# Patient Record
Sex: Female | Born: 1985
Health system: Southern US, Community
[De-identification: ages and names within clinical notes are randomized; demographics above are authoritative.]

## PROBLEM LIST (undated history)

## (undated) DIAGNOSIS — M549 Dorsalgia, unspecified: Secondary | ICD-10-CM

## (undated) DIAGNOSIS — R002 Palpitations: Secondary | ICD-10-CM

## (undated) DIAGNOSIS — O4590 Premature separation of placenta, unspecified, unspecified trimester: Secondary | ICD-10-CM

## (undated) DIAGNOSIS — E559 Vitamin D deficiency, unspecified: Secondary | ICD-10-CM

## (undated) DIAGNOSIS — M2550 Pain in unspecified joint: Secondary | ICD-10-CM

## (undated) DIAGNOSIS — K5900 Constipation, unspecified: Secondary | ICD-10-CM

## (undated) HISTORY — DX: Constipation, unspecified: K59.00

## (undated) HISTORY — PX: DENTAL SURGERY: SHX609

## (undated) HISTORY — DX: Pain in unspecified joint: M25.50

## (undated) HISTORY — DX: Vitamin D deficiency, unspecified: E55.9

## (undated) HISTORY — DX: Palpitations: R00.2

## (undated) HISTORY — PX: HERNIA REPAIR: SHX51

## (undated) HISTORY — DX: Dorsalgia, unspecified: M54.9

---
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
## 2004-10-06 ENCOUNTER — Inpatient Hospital Stay (HOSPITAL_COMMUNITY): Admission: AD | Admit: 2004-10-06 | Discharge: 2004-10-06 | Payer: Self-pay | Admitting: Obstetrics and Gynecology

## 2004-10-09 ENCOUNTER — Inpatient Hospital Stay (HOSPITAL_COMMUNITY): Admission: AD | Admit: 2004-10-09 | Discharge: 2004-10-09 | Payer: Self-pay | Admitting: Obstetrics and Gynecology

## 2004-10-16 ENCOUNTER — Inpatient Hospital Stay (HOSPITAL_COMMUNITY): Admission: RE | Admit: 2004-10-16 | Discharge: 2004-10-16 | Payer: Self-pay | Admitting: Obstetrics and Gynecology

## 2004-11-20 ENCOUNTER — Inpatient Hospital Stay (HOSPITAL_COMMUNITY): Admission: AD | Admit: 2004-11-20 | Discharge: 2004-11-21 | Payer: Self-pay | Admitting: Obstetrics & Gynecology

## 2004-11-28 ENCOUNTER — Ambulatory Visit (HOSPITAL_COMMUNITY): Admission: AD | Admit: 2004-11-28 | Discharge: 2004-11-28 | Payer: Self-pay | Admitting: Gynecology

## 2004-11-28 ENCOUNTER — Encounter (INDEPENDENT_AMBULATORY_CARE_PROVIDER_SITE_OTHER): Payer: Self-pay | Admitting: Specialist

## 2005-01-01 ENCOUNTER — Encounter: Payer: Self-pay | Admitting: Obstetrics and Gynecology

## 2005-01-01 ENCOUNTER — Ambulatory Visit: Payer: Self-pay | Admitting: Obstetrics and Gynecology

## 2005-04-28 ENCOUNTER — Inpatient Hospital Stay (HOSPITAL_COMMUNITY): Admission: AD | Admit: 2005-04-28 | Discharge: 2005-04-28 | Payer: Self-pay | Admitting: Obstetrics and Gynecology

## 2005-05-09 ENCOUNTER — Inpatient Hospital Stay (HOSPITAL_COMMUNITY): Admission: AD | Admit: 2005-05-09 | Discharge: 2005-05-09 | Payer: Self-pay | Admitting: Family Medicine

## 2005-05-14 ENCOUNTER — Inpatient Hospital Stay (HOSPITAL_COMMUNITY): Admission: AD | Admit: 2005-05-14 | Discharge: 2005-05-14 | Payer: Self-pay | Admitting: Family Medicine

## 2005-05-25 ENCOUNTER — Inpatient Hospital Stay (HOSPITAL_COMMUNITY): Admission: AD | Admit: 2005-05-25 | Discharge: 2005-05-25 | Payer: Self-pay | Admitting: *Deleted

## 2005-05-29 ENCOUNTER — Inpatient Hospital Stay (HOSPITAL_COMMUNITY): Admission: AD | Admit: 2005-05-29 | Discharge: 2005-05-29 | Payer: Self-pay | Admitting: Family Medicine

## 2005-06-29 ENCOUNTER — Inpatient Hospital Stay (HOSPITAL_COMMUNITY): Admission: AD | Admit: 2005-06-29 | Discharge: 2005-06-29 | Payer: Self-pay | Admitting: Obstetrics and Gynecology

## 2005-07-05 ENCOUNTER — Inpatient Hospital Stay (HOSPITAL_COMMUNITY): Admission: AD | Admit: 2005-07-05 | Discharge: 2005-07-05 | Payer: Self-pay | Admitting: Family Medicine

## 2005-08-02 ENCOUNTER — Inpatient Hospital Stay (HOSPITAL_COMMUNITY): Admission: AD | Admit: 2005-08-02 | Discharge: 2005-08-02 | Payer: Self-pay | Admitting: Obstetrics & Gynecology

## 2005-08-22 ENCOUNTER — Inpatient Hospital Stay (HOSPITAL_COMMUNITY): Admission: AD | Admit: 2005-08-22 | Discharge: 2005-08-22 | Payer: Self-pay | Admitting: *Deleted

## 2005-12-14 ENCOUNTER — Ambulatory Visit: Payer: Self-pay | Admitting: Obstetrics and Gynecology

## 2005-12-14 ENCOUNTER — Inpatient Hospital Stay (HOSPITAL_COMMUNITY): Admission: AD | Admit: 2005-12-14 | Discharge: 2005-12-14 | Payer: Self-pay | Admitting: Obstetrics & Gynecology

## 2005-12-24 ENCOUNTER — Ambulatory Visit: Payer: Self-pay | Admitting: Family Medicine

## 2005-12-28 ENCOUNTER — Ambulatory Visit: Payer: Self-pay | Admitting: Obstetrics & Gynecology

## 2005-12-28 ENCOUNTER — Ambulatory Visit (HOSPITAL_COMMUNITY): Admission: RE | Admit: 2005-12-28 | Discharge: 2005-12-28 | Payer: Self-pay | Admitting: *Deleted

## 2005-12-31 ENCOUNTER — Ambulatory Visit: Payer: Self-pay | Admitting: Family Medicine

## 2006-01-01 ENCOUNTER — Inpatient Hospital Stay (HOSPITAL_COMMUNITY): Admission: AD | Admit: 2006-01-01 | Discharge: 2006-01-01 | Payer: Self-pay | Admitting: Obstetrics and Gynecology

## 2006-01-04 ENCOUNTER — Encounter: Payer: Self-pay | Admitting: Obstetrics and Gynecology

## 2006-01-04 ENCOUNTER — Ambulatory Visit: Payer: Self-pay | Admitting: Obstetrics & Gynecology

## 2006-01-04 ENCOUNTER — Encounter (INDEPENDENT_AMBULATORY_CARE_PROVIDER_SITE_OTHER): Payer: Self-pay | Admitting: *Deleted

## 2006-01-04 ENCOUNTER — Inpatient Hospital Stay (HOSPITAL_COMMUNITY): Admission: AD | Admit: 2006-01-04 | Discharge: 2006-01-07 | Payer: Self-pay | Admitting: Obstetrics and Gynecology

## 2006-01-04 DIAGNOSIS — O4590 Premature separation of placenta, unspecified, unspecified trimester: Secondary | ICD-10-CM

## 2006-06-11 ENCOUNTER — Inpatient Hospital Stay (HOSPITAL_COMMUNITY): Admission: AD | Admit: 2006-06-11 | Discharge: 2006-06-11 | Payer: Self-pay | Admitting: Gynecology

## 2006-11-07 ENCOUNTER — Emergency Department (HOSPITAL_COMMUNITY): Admission: EM | Admit: 2006-11-07 | Discharge: 2006-11-07 | Payer: Self-pay | Admitting: Emergency Medicine

## 2007-01-29 ENCOUNTER — Emergency Department (HOSPITAL_COMMUNITY): Admission: EM | Admit: 2007-01-29 | Discharge: 2007-01-29 | Payer: Self-pay | Admitting: Emergency Medicine

## 2008-03-08 ENCOUNTER — Inpatient Hospital Stay (HOSPITAL_COMMUNITY): Admission: AD | Admit: 2008-03-08 | Discharge: 2008-03-08 | Payer: Self-pay | Admitting: Family Medicine

## 2008-03-11 ENCOUNTER — Inpatient Hospital Stay (HOSPITAL_COMMUNITY): Admission: AD | Admit: 2008-03-11 | Discharge: 2008-03-11 | Payer: Self-pay | Admitting: Obstetrics and Gynecology

## 2008-03-19 ENCOUNTER — Inpatient Hospital Stay (HOSPITAL_COMMUNITY): Admission: RE | Admit: 2008-03-19 | Discharge: 2008-03-19 | Payer: Self-pay | Admitting: Obstetrics & Gynecology

## 2008-04-05 ENCOUNTER — Ambulatory Visit: Payer: Self-pay | Admitting: Family Medicine

## 2008-04-05 ENCOUNTER — Encounter: Payer: Self-pay | Admitting: Family Medicine

## 2008-04-05 LAB — CONVERTED CEMR LAB
Antibody Screen: NEGATIVE
Basophils Relative: 0 % (ref 0–1)
HCT: 39.3 % (ref 36.0–46.0)
Lymphocytes Relative: 33 % (ref 12–46)
MCV: 87.9 fL (ref 78.0–100.0)
Platelets: 325 10*3/uL (ref 150–400)
RBC: 4.47 M/uL (ref 3.87–5.11)
Rubella: 20 intl units/mL — ABNORMAL HIGH
WBC: 4.8 10*3/uL (ref 4.0–10.5)

## 2008-04-18 ENCOUNTER — Encounter: Payer: Self-pay | Admitting: Family Medicine

## 2008-04-18 ENCOUNTER — Ambulatory Visit: Payer: Self-pay | Admitting: Family Medicine

## 2008-04-18 LAB — CONVERTED CEMR LAB

## 2008-04-26 ENCOUNTER — Telehealth (INDEPENDENT_AMBULATORY_CARE_PROVIDER_SITE_OTHER): Payer: Self-pay | Admitting: *Deleted

## 2008-04-30 ENCOUNTER — Ambulatory Visit: Payer: Self-pay | Admitting: Family Medicine

## 2008-05-10 ENCOUNTER — Encounter: Payer: Self-pay | Admitting: Family Medicine

## 2008-05-10 DIAGNOSIS — E669 Obesity, unspecified: Secondary | ICD-10-CM | POA: Insufficient documentation

## 2008-05-10 DIAGNOSIS — O34219 Maternal care for unspecified type scar from previous cesarean delivery: Secondary | ICD-10-CM | POA: Insufficient documentation

## 2008-05-10 DIAGNOSIS — E6609 Other obesity due to excess calories: Secondary | ICD-10-CM | POA: Insufficient documentation

## 2008-05-11 ENCOUNTER — Ambulatory Visit: Payer: Self-pay | Admitting: Family Medicine

## 2008-05-17 ENCOUNTER — Ambulatory Visit: Payer: Self-pay | Admitting: Family Medicine

## 2008-05-17 ENCOUNTER — Encounter: Payer: Self-pay | Admitting: Family Medicine

## 2008-06-14 ENCOUNTER — Encounter: Payer: Self-pay | Admitting: Family Medicine

## 2008-06-14 ENCOUNTER — Ambulatory Visit: Payer: Self-pay | Admitting: Family Medicine

## 2008-06-19 ENCOUNTER — Encounter: Payer: Self-pay | Admitting: Family Medicine

## 2008-07-02 ENCOUNTER — Telehealth: Payer: Self-pay | Admitting: Family Medicine

## 2008-07-05 ENCOUNTER — Telehealth: Payer: Self-pay | Admitting: Family Medicine

## 2008-07-09 ENCOUNTER — Telehealth: Payer: Self-pay | Admitting: Family Medicine

## 2008-07-12 ENCOUNTER — Telehealth: Payer: Self-pay | Admitting: Family Medicine

## 2008-07-12 ENCOUNTER — Ambulatory Visit: Payer: Self-pay | Admitting: Family Medicine

## 2008-07-12 ENCOUNTER — Encounter: Payer: Self-pay | Admitting: Family Medicine

## 2008-07-12 DIAGNOSIS — Q638 Other specified congenital malformations of kidney: Secondary | ICD-10-CM | POA: Insufficient documentation

## 2008-07-16 ENCOUNTER — Ambulatory Visit (HOSPITAL_COMMUNITY): Admission: RE | Admit: 2008-07-16 | Discharge: 2008-07-16 | Payer: Self-pay | Admitting: Family Medicine

## 2008-07-18 ENCOUNTER — Telehealth: Payer: Self-pay | Admitting: Family Medicine

## 2008-07-24 ENCOUNTER — Telehealth: Payer: Self-pay | Admitting: Family Medicine

## 2008-08-09 ENCOUNTER — Encounter: Payer: Self-pay | Admitting: *Deleted

## 2008-08-09 ENCOUNTER — Ambulatory Visit: Payer: Self-pay | Admitting: Family Medicine

## 2008-08-09 ENCOUNTER — Encounter: Payer: Self-pay | Admitting: Family Medicine

## 2008-08-09 LAB — CONVERTED CEMR LAB
Hemoglobin: 11.4 g/dL — ABNORMAL LOW (ref 12.0–15.0)
Platelets: 277 10*3/uL (ref 150–400)
WBC: 4.7 10*3/uL (ref 4.0–10.5)

## 2008-08-13 ENCOUNTER — Ambulatory Visit (HOSPITAL_COMMUNITY): Admission: RE | Admit: 2008-08-13 | Discharge: 2008-08-13 | Payer: Self-pay | Admitting: Family Medicine

## 2008-08-21 ENCOUNTER — Encounter: Payer: Self-pay | Admitting: Family Medicine

## 2008-08-21 ENCOUNTER — Ambulatory Visit: Payer: Self-pay | Admitting: Family Medicine

## 2008-08-26 ENCOUNTER — Ambulatory Visit: Payer: Self-pay | Admitting: Obstetrics and Gynecology

## 2008-08-26 ENCOUNTER — Inpatient Hospital Stay (HOSPITAL_COMMUNITY): Admission: AD | Admit: 2008-08-26 | Discharge: 2008-08-27 | Payer: Self-pay | Admitting: Obstetrics and Gynecology

## 2008-08-27 ENCOUNTER — Encounter: Payer: Self-pay | Admitting: Family Medicine

## 2008-08-27 ENCOUNTER — Telehealth: Payer: Self-pay | Admitting: *Deleted

## 2008-08-27 ENCOUNTER — Ambulatory Visit: Payer: Self-pay | Admitting: Family Medicine

## 2008-08-27 LAB — CONVERTED CEMR LAB
Chlamydia, DNA Probe: NEGATIVE
GC Probe Amp, Genital: NEGATIVE
Whiff Test: NEGATIVE

## 2008-08-31 ENCOUNTER — Telehealth: Payer: Self-pay | Admitting: Family Medicine

## 2008-09-03 ENCOUNTER — Ambulatory Visit: Payer: Self-pay | Admitting: Family Medicine

## 2008-09-03 ENCOUNTER — Encounter: Payer: Self-pay | Admitting: Family Medicine

## 2008-09-03 ENCOUNTER — Telehealth: Payer: Self-pay | Admitting: Family Medicine

## 2008-09-10 ENCOUNTER — Telehealth: Payer: Self-pay | Admitting: *Deleted

## 2008-09-14 ENCOUNTER — Ambulatory Visit: Payer: Self-pay | Admitting: Family Medicine

## 2008-09-17 ENCOUNTER — Telehealth: Payer: Self-pay | Admitting: Family Medicine

## 2008-09-20 ENCOUNTER — Encounter: Payer: Self-pay | Admitting: Family Medicine

## 2008-09-20 ENCOUNTER — Encounter: Payer: Self-pay | Admitting: *Deleted

## 2008-09-20 ENCOUNTER — Ambulatory Visit: Payer: Self-pay | Admitting: Family Medicine

## 2008-09-27 ENCOUNTER — Telehealth: Payer: Self-pay | Admitting: Family Medicine

## 2008-10-02 ENCOUNTER — Encounter: Payer: Self-pay | Admitting: Family Medicine

## 2008-10-02 ENCOUNTER — Ambulatory Visit (HOSPITAL_COMMUNITY): Admission: RE | Admit: 2008-10-02 | Discharge: 2008-10-02 | Payer: Self-pay | Admitting: *Deleted

## 2008-10-02 ENCOUNTER — Encounter: Payer: Self-pay | Admitting: *Deleted

## 2008-10-04 ENCOUNTER — Ambulatory Visit: Payer: Self-pay | Admitting: Family Medicine

## 2008-10-05 ENCOUNTER — Telehealth (INDEPENDENT_AMBULATORY_CARE_PROVIDER_SITE_OTHER): Payer: Self-pay | Admitting: *Deleted

## 2008-10-08 ENCOUNTER — Encounter (INDEPENDENT_AMBULATORY_CARE_PROVIDER_SITE_OTHER): Payer: Self-pay | Admitting: *Deleted

## 2008-10-11 ENCOUNTER — Telehealth: Payer: Self-pay | Admitting: Family Medicine

## 2008-10-11 ENCOUNTER — Encounter: Payer: Self-pay | Admitting: *Deleted

## 2008-10-15 ENCOUNTER — Encounter: Admission: RE | Admit: 2008-10-15 | Discharge: 2008-10-15 | Payer: Self-pay | Admitting: Family Medicine

## 2008-10-15 ENCOUNTER — Telehealth: Payer: Self-pay | Admitting: Family Medicine

## 2008-10-18 ENCOUNTER — Encounter: Payer: Self-pay | Admitting: Family Medicine

## 2008-10-18 ENCOUNTER — Ambulatory Visit: Payer: Self-pay | Admitting: Family Medicine

## 2008-10-23 ENCOUNTER — Encounter: Payer: Self-pay | Admitting: Family Medicine

## 2008-10-24 ENCOUNTER — Ambulatory Visit: Payer: Self-pay | Admitting: Family Medicine

## 2008-11-01 ENCOUNTER — Ambulatory Visit: Payer: Self-pay | Admitting: Family Medicine

## 2008-11-09 ENCOUNTER — Ambulatory Visit: Payer: Self-pay | Admitting: Family Medicine

## 2008-11-12 ENCOUNTER — Encounter: Payer: Self-pay | Admitting: *Deleted

## 2008-11-13 ENCOUNTER — Ambulatory Visit: Payer: Self-pay | Admitting: Obstetrics & Gynecology

## 2008-11-13 ENCOUNTER — Encounter: Payer: Self-pay | Admitting: Family Medicine

## 2008-11-13 ENCOUNTER — Ambulatory Visit (HOSPITAL_COMMUNITY): Admission: RE | Admit: 2008-11-13 | Discharge: 2008-11-13 | Payer: Self-pay | Admitting: *Deleted

## 2008-11-13 ENCOUNTER — Encounter: Payer: Self-pay | Admitting: *Deleted

## 2008-11-14 ENCOUNTER — Ambulatory Visit: Payer: Self-pay | Admitting: Family Medicine

## 2008-11-16 ENCOUNTER — Telehealth: Payer: Self-pay | Admitting: Family Medicine

## 2008-11-16 ENCOUNTER — Encounter: Payer: Self-pay | Admitting: *Deleted

## 2008-11-16 ENCOUNTER — Ambulatory Visit: Payer: Self-pay | Admitting: Obstetrics & Gynecology

## 2008-11-20 ENCOUNTER — Inpatient Hospital Stay (HOSPITAL_COMMUNITY): Admission: RE | Admit: 2008-11-20 | Discharge: 2008-11-23 | Payer: Self-pay | Admitting: Obstetrics & Gynecology

## 2008-11-20 ENCOUNTER — Ambulatory Visit: Payer: Self-pay | Admitting: Physician Assistant

## 2008-12-10 ENCOUNTER — Telehealth: Payer: Self-pay | Admitting: Family Medicine

## 2008-12-11 ENCOUNTER — Encounter: Payer: Self-pay | Admitting: Family Medicine

## 2008-12-27 ENCOUNTER — Ambulatory Visit: Payer: Self-pay | Admitting: Family Medicine

## 2009-01-20 ENCOUNTER — Telehealth: Payer: Self-pay | Admitting: Family Medicine

## 2009-03-29 ENCOUNTER — Ambulatory Visit: Payer: Self-pay | Admitting: Family Medicine

## 2009-03-29 ENCOUNTER — Telehealth: Payer: Self-pay | Admitting: Family Medicine

## 2009-03-29 DIAGNOSIS — N921 Excessive and frequent menstruation with irregular cycle: Secondary | ICD-10-CM

## 2009-03-29 LAB — CONVERTED CEMR LAB
Beta hcg, urine, semiquantitative: NEGATIVE
Whiff Test: NEGATIVE

## 2009-12-25 ENCOUNTER — Inpatient Hospital Stay (HOSPITAL_COMMUNITY)
Admission: AD | Admit: 2009-12-25 | Discharge: 2009-12-25 | Payer: Self-pay | Source: Home / Self Care | Admitting: Obstetrics and Gynecology

## 2010-01-09 ENCOUNTER — Encounter: Payer: Self-pay | Admitting: Family Medicine

## 2010-01-17 IMAGING — US US OB COMP LESS 14 WK
1 series · 14 of 28 positions shown · non-contrast
Comparison: none

OBSTETRICAL ULTRASOUND:
 This ultrasound exam was performed in the [HOSPITAL] Ultrasound Department.  The OB US report was generated in the AS system, and faxed to the ordering physician.  This report is also available in [REDACTED] PACS.

[Series 1: us ob comp less 14 wks · 14 of 44 slices shown]
[im 2/44]
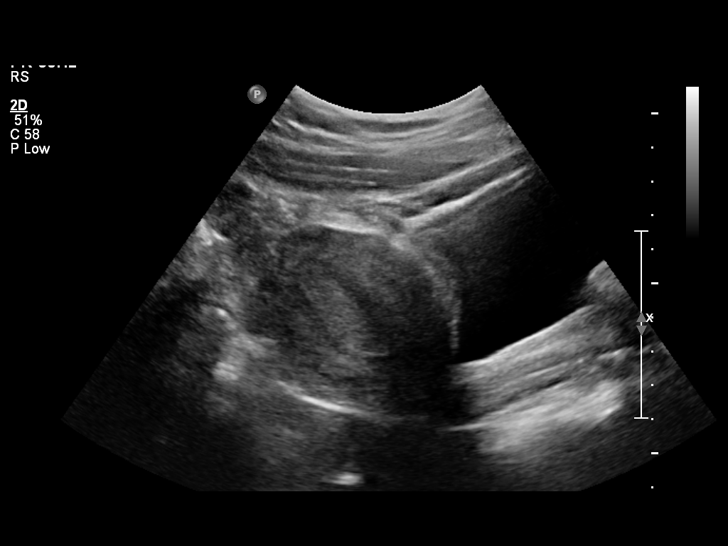
[im 5/44]
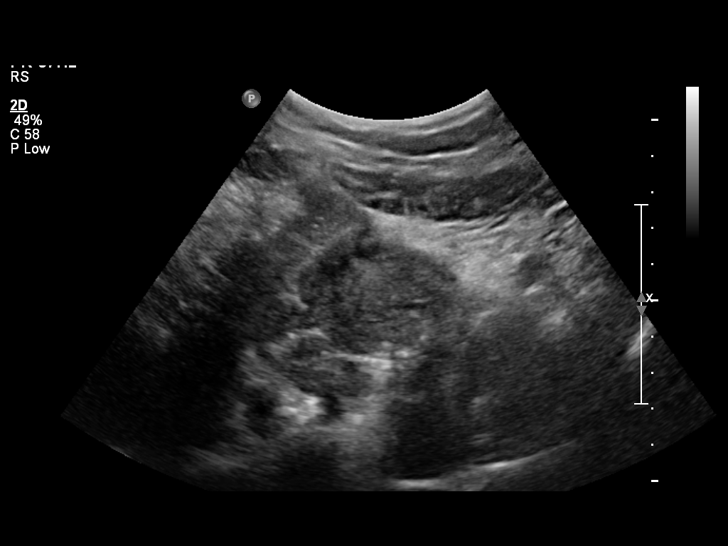
[im 8/44]
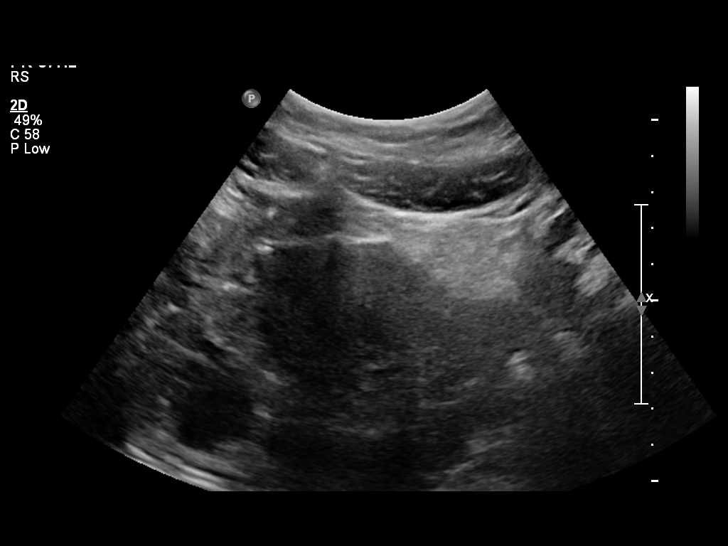
[im 12/44]
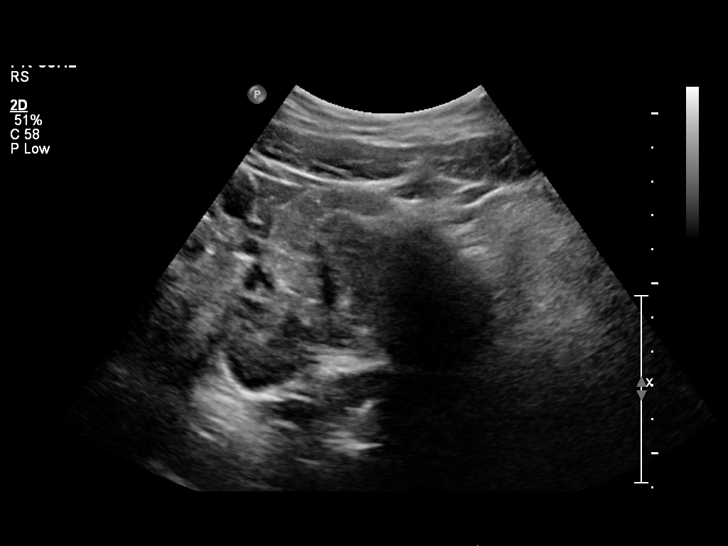
[im 15/44]
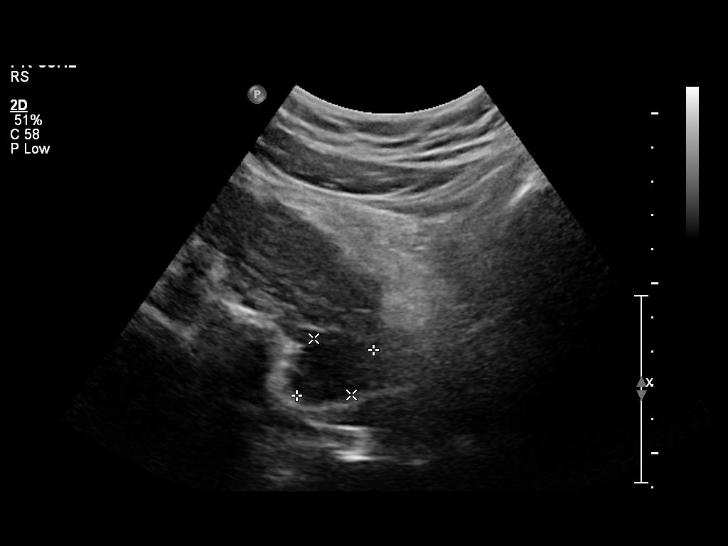
[im 18/44]
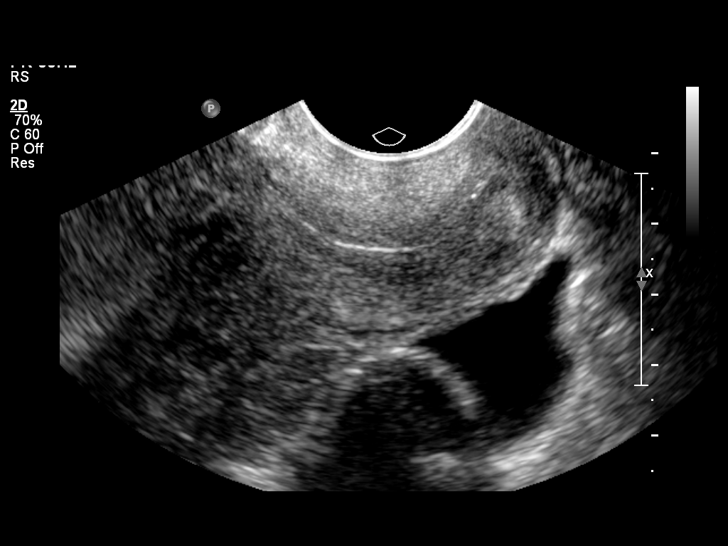
[im 21/44]
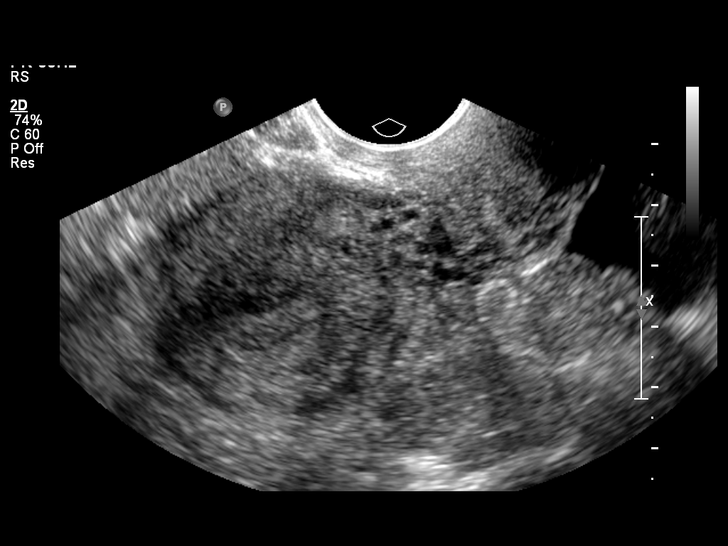
[im 24/44]
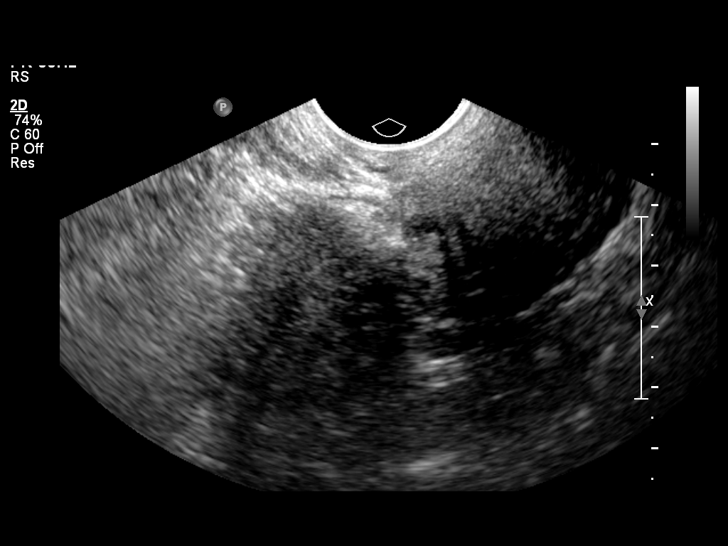
[im 28/44]
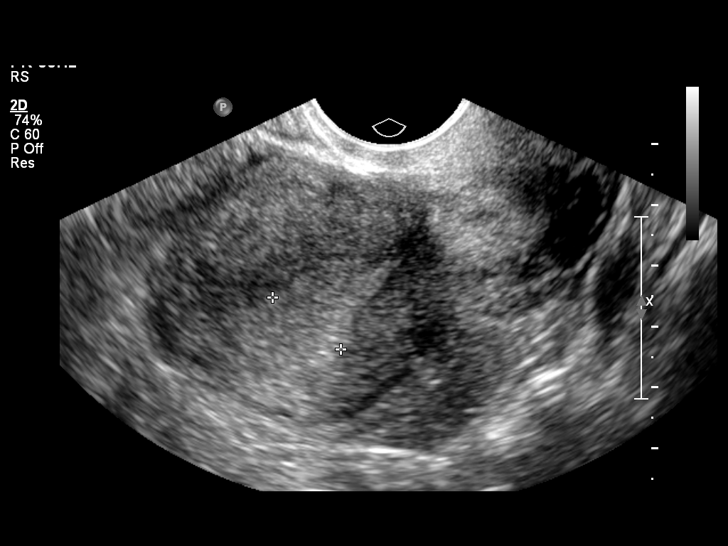
[im 31/44]
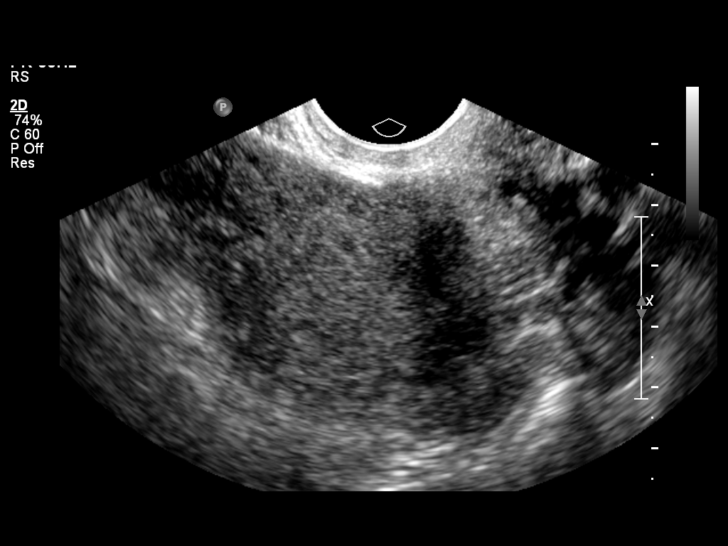
[im 34/44]
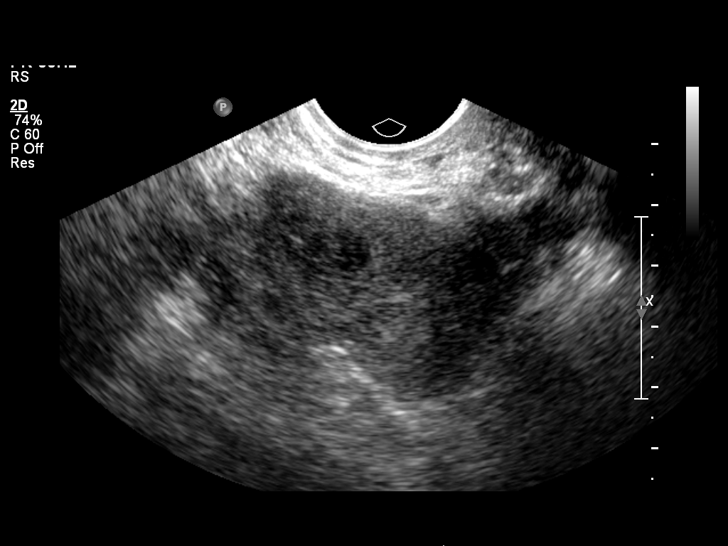
[im 37/44]
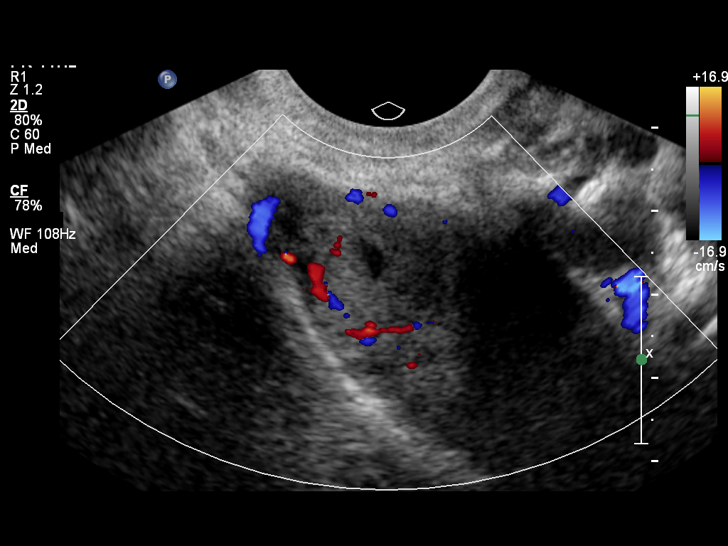
[im 40/44]
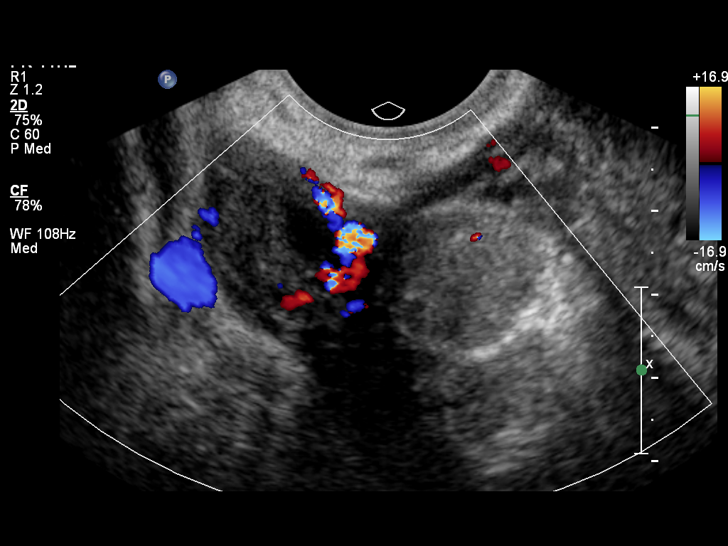
[im 44/44]
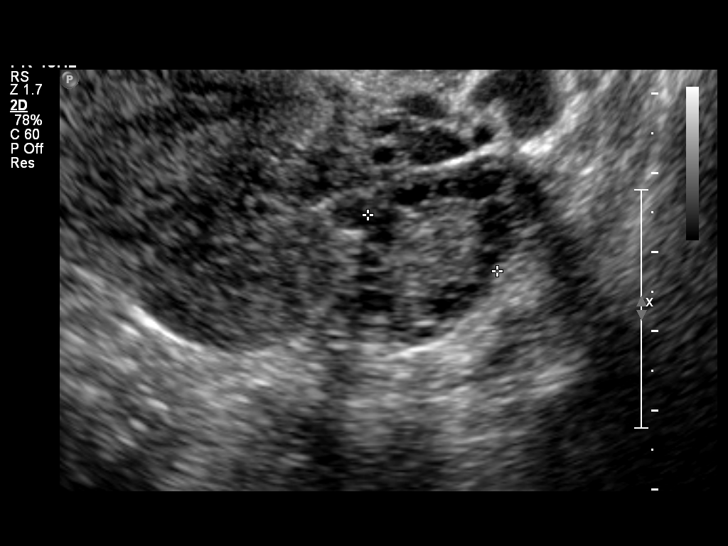

[14 of 28 positions shown; findings below may reference images not displayed]

IMPRESSION: See AS Obstetric US report.

## 2010-01-19 NOTE — L&D Delivery Note (Signed)
Delivery Note At 4:56 PM a viable female was delivered via Vaginal, Spontaneous Delivery in waterbirth tub (Presentation: Left Occiput Anterior).  APGAR: 8, 9; weight 6 lb 8.9 oz (2975 g).   Placenta status: Intact, Spontaneous.  Cord: 3 vessels with the following complications: .  Unable to obtain cord blood.  Pushed 18 minutes, with SROM (clear fluid) at 4:48p.  Anesthesia: None  Episiotomy: None Lacerations: None Suture Repair: none Est. Blood Loss (mL): 300  Mom to postpartum.  Baby to nursery-stable.  Skin to skin with mom in recovery phase.  Ukiah Trawick L 08/28/2010, 5:45 PM

## 2010-02-18 NOTE — Progress Notes (Signed)
  Phone Note Call from Patient   Reason for Call: Talk to Doctor Summary of Call: Had unprotected sex.  Had a baby in November.  Is not on any birth control.  Requested emergency contraceptive pill.  Rx. called into her pharmacy. Initial call taken by: Angelena Sole MD,  January 20, 2009 6:44 PM

## 2010-02-18 NOTE — Assessment & Plan Note (Signed)
Summary: Vag d/c x 5 days   Vital Signs:  Patient profile:   25 year old female Height:      66.5 inches Weight:      208.50 pounds BMI:     33.27 Temp:     98.2 degrees F oral Pulse rate:   69 / minute Pulse rhythm:   regular BP sitting:   102 / 69  (left arm)  Vitals Entered By: Modesta Messing LPN (August 27, 2008 1:56 PM) CC: vaginal discharge Is Patient Diabetic? No Pain Assessment Patient in pain? no        Primary Care Provider:  Ardeen Garland  MD  CC:  vaginal discharge.  History of Present Illness: Olivia Lawson is a 25 y/o G3P1011 at 26.[redacted] weeks GA presenting with c/o of vaginal discharge, white to yellow x 1 week, getting thicker, slight odor; she denies vaginal itch, rash, abdominal pain, dysuria, urinary frequency; she is sexually active with 1 partner, does not use condoms, no history of STDs, + hx of yeast infections.  Current Medications (verified): 1)  None  Allergies (verified): No Known Drug Allergies  Review of Systems       per HPI, otherwise negative  Physical Exam  General:  Well-developed, well-nourished ,in no acute distress; alert, appropriate and cooperative throughout examination. Vital signs reviewed. Genitalia:  Pelvic Exam:        External: normal female genitalia without lesions or masses        Vagina: normal without lesions or masses, mild amount of thin white discharge        Cervix: normal without lesions or masses, no friability        Adnexa: normal bimanual exam without masses or fullness        Uterus: normal by palpation        Pap smear: not performed   Impression & Recommendations:  Problem # 1:  VAGINAL DISCHARGE (ICD-623.5) Assessment New  No BV, yeast, or trichomonas on wetprep. Also obtained GC/Chlam. Will await those results. Likely normal leukorrhea of pregnancy. Gave RED FLAGS for f/u.  Orders: [facility]- Est Level  3 ([id number])  Other Orders[facility] ([id number][id number]  Patient  Instructions: 1)  It was very nice to meet you today. 2)  You test did not show that you have a bacterial or yeast infection today. Your vaginal discharge is like normal discharge that you can get during pregnancy. 3)  Let me know if it worsens or if you develop any of the symptoms that we talked about today. 4)  We will let you know the results of your labs if they are abnormal. 5)  Follow up with your PCP for your next scheduled OB check.  Laboratory Results  Date/Time Received: August 27, 2008 3:12 PM  Date/Time Reported: August 27, 2008 3:18 PM   [employer] Source: vag WBC/hpf: >20 Bacteria/hpf: 3+  Rods Clue cells/hpf: none  Negative whiff Yeast/hpf: none Trichomonas/hpf: none Comments: ...............test performed by......Marland KitchenBonnie A. [place], [place] (ASCP)

## 2010-02-18 NOTE — Progress Notes (Signed)
Summary: triage  Phone Note Call from Patient Call back at Home Phone [phone]   Caller: Patient Summary of Call: Has had her period for more than 10 days is this normal? Initial call taken by: Clydell Hakim,  March 29, 2009 11:34 AM  Follow-up for Phone Call        last month menses were 10 days as well.  had unprotected sex & had to take plan B. I explained plan B can change normal menses. states it is brown. started 2 sunday's ago. she is very worried. appt with Dr.Gutierrez at 3 today Follow-up by: Golden Circle RN,  March 29, 2009 11:38 AM

## 2010-02-20 NOTE — Miscellaneous (Signed)
  Clinical Lists Changes  Problems: Removed problem of FAMILY PLANNING (ICD-V25.09) Removed problem of VAGINAL DISCHARGE (ICD-623.5) Removed problem of POST TERM PG DELIV W/WO MENTION ANTPRTM COND (ICD-645.11) Removed problem of DYSPNEA (ICD-786.05) Removed problem of PREGNANCY (ICD-V22.2) Removed problem of SUPERVISION OF OTHER NORMAL PREGNANCY (ICD-V22.1)

## 2010-02-20 NOTE — Assessment & Plan Note (Signed)
Summary: long menses//[facility]   Vital Signs:  Patient profile:   25 year old female Weight:      195.5 pounds Pulse rate:   60 / minute BP sitting:   105 / 69  (right arm)  Vitals Entered By: Renato Battles slade,cma CC: prolonged menses over the last months. menses every 2 weeks. LMP 03-17-09 until current. feels like vagina is different since Wednesday. pt delivered baby in 11-10 Is Patient Diabetic? No Pain Assessment Patient in pain? no        Primary Care Provider:  Ardeen Garland  MD  CC:  prolonged menses over the last months. menses every 2 weeks. LMP 03-17-09 until current. feels like vagina is different since Wednesday. pt delivered baby in 11-10.  History of Present Illness: CC: prolonged menses  baby boy 4 mo ago.  has had 2 children, small/avg size.  1st C/S, second VBAC.  2 d ago felt 'hole' in vagina.  Also thinks she's had long period - started 2/27 and has been bleeding since.  Brown d/c.  Bleeding eventually stopped, then she had sex and bleeding restarted came back.  No other discharge, itching, or dysuria.  No abd pain/fevers/chills.  First "period" after delivery was after she took Plan B 01/20/2009.  Since then feels like has had irregular bleeding.  Normally has mostly regular cycle (>30 days) and periods last 5-7 days.  Not usually heavy bleeding.  This time same amt of bleeding but continued bleeding which is new.  Took Plan B 01/2009 because of unprotected intercourse.  Breasfeeding.  Not more fatigued, not dizzy with standing.  Habits & Providers  Alcohol-Tobacco-Diet     Tobacco Status: never  Allergies (verified): No Known Drug Allergies  Past History:  Past Medical History: Para 2 - Dec 2007 and Nov 2010  Past Surgical History: C section 12/2005 VBAC 11/2008  Physical Exam  General:  alert, well-developed, well-nourished, and well-hydrated.  NAD, vitals reviewed Genitalia:  Pelvic Exam:        External: normal female genitalia without lesions  or masses        Vagina: normal without lesions or masses        Cervix: normal without lesions or masses - evident bleeding from cervix        Adnexa: normal bimanual exam without masses or fullness        Uterus: normal by palpation        Pap smear: not performed Skin:  turgor normal, color normal, no rashes, and no suspicious lesions.     Impression & Recommendations:  Problem # 1:  VAGINAL DISCHARGE (ICD-623.5) wet prep negative for infection.  upreg negative. Orders: Wet Prep- [facility] ([id number]) U Preg-[facility] ([id number]) [facility]- Est Level  3 (84696)  Problem # 2:  FAMILY PLANNING (ICD-V25.09) upreg neg.  pap smear due 04/17/2009.  Problem # 3:  METRORRHAGIA (ICD-626.6) discussed how irregular long bleed could be due to combination of Plan B (large amount of progesterone in a single dose), breastfeeding (will prolong initiation of regular periosd) and recent baby (body likely hasn't returned to normal hormonal state).  Wet prep neg, Upreg neg.  Advised to give body 1 more week to stop, then if continued , to call us and we can prescribe provera 10mg  x 10 days to see if we can help stop bleeding.  If continued bleed, will need to consider [place] to eval for other causes. Orders: [facility]- Est Level  3 ([id number])  Complete Medication List: 1)  P  D Natal Vitamins/folic Acid Tabs (Prenatal multivit-min-fe-fa) .Marland Kitchen.. 1 by mouth daily 2)  Nuvaring 0.12-0.015 Mg/24hr Ring (Etonogestrel-ethinyl estradiol) .... Insert ring into vagina and remove 3 weeks later.  insert new ring one week after removal. disp:3 month supply  Patient Instructions: 1)  The irregular bleed is probably a combination of recent baby, currently breastfeeding, and plan B.   2)  We will give you one more week to stop bleeding on your own, if that doesn't happen, call our clinic and we will give you medicine to help you stop bleeding. 3)  We checked a pregnancy test today. 4)  Call clinic with questions.  Pleasure to meet you today.   Prevention  & Chronic Care Immunizations   Influenza vaccine: Not documented    Tetanus booster: Not documented    Pneumococcal vaccine: Not documented  Other Screening   Pap smear: NEGATIVE FOR INTRAEPITHELIAL LESIONS OR MALIGNANCY.  (04/18/2008)   Smoking status: never  (03/29/2009)   Laboratory Results   Urine Tests  Date/Time Received: March 29, 2009 4:04 PM  Date/Time Reported: March 29, 2009 4:20 PM     Urine HCG: negative Comments: ...........test performed by...........Marland KitchenTerese Door, CMA  Date/Time Received: March 29, 2009 4:04 PM  Date/Time Reported: March 29, 2009 4:13 PM   [employer] Source: vaginal WBC/hpf: 0-3 Bacteria/hpf: 3+  Rods Clue cells/hpf: none  Negative whiff Yeast/hpf: none Trichomonas/hpf: none Comments: 5-10 RBC's present ...........test performed by...........Marland KitchenTerese Door, CMA

## 2010-03-03 LAB — HEPATITIS B SURFACE ANTIGEN: Hepatitis B Surface Ag: NEGATIVE

## 2010-03-03 LAB — ABO/RH: RH Type: POSITIVE

## 2010-03-03 LAB — RUBELLA ANTIBODY, IGM: Rubella: IMMUNE

## 2010-03-03 LAB — TYPE AND SCREEN: Antibody Screen: NEGATIVE

## 2010-03-03 LAB — HIV ANTIBODY (ROUTINE TESTING W REFLEX): HIV: NONREACTIVE

## 2010-03-03 LAB — RPR: RPR: NONREACTIVE

## 2010-03-12 ENCOUNTER — Encounter: Payer: Self-pay | Admitting: *Deleted

## 2010-03-31 LAB — GC/CHLAMYDIA PROBE AMP, GENITAL
Chlamydia, DNA Probe: NEGATIVE
GC Probe Amp, Genital: NEGATIVE

## 2010-03-31 LAB — URINALYSIS, ROUTINE W REFLEX MICROSCOPIC
Glucose, UA: NEGATIVE mg/dL
Nitrite: NEGATIVE
Specific Gravity, Urine: >1.03 — ABNORMAL HIGH (ref 1.005–1.030)
pH: 5.5 (ref 5.0–8.0)

## 2010-03-31 LAB — WET PREP, GENITAL: Yeast Wet Prep HPF POC: NONE SEEN

## 2010-03-31 LAB — ABO/RH: ABO/RH(D): A POS

## 2010-03-31 LAB — HCG, QUANTITATIVE, PREGNANCY: hCG, Beta Chain, Quant, S: 1954 m[IU]/mL — ABNORMAL HIGH (ref <5)

## 2010-03-31 LAB — CBC
Hemoglobin: 12.7 g/dL (ref 12.0–15.0)
Platelets: 290 10*3/uL (ref 150–400)
RBC: 4.09 MIL/uL (ref 3.87–5.11)
WBC: 5.5 10*3/uL (ref 4.0–10.5)

## 2010-04-23 LAB — CBC
Hemoglobin: 11.2 g/dL — ABNORMAL LOW (ref 12.0–15.0)
MCHC: 33.4 g/dL (ref 30.0–36.0)
RBC: 3.94 MIL/uL (ref 3.87–5.11)
WBC: 3.7 10*3/uL — ABNORMAL LOW (ref 4.0–10.5)

## 2010-04-27 LAB — GLUCOSE, CAPILLARY: Glucose-Capillary: 109 mg/dL — ABNORMAL HIGH (ref 70–99)

## 2010-04-30 LAB — GLUCOSE, CAPILLARY: Glucose-Capillary: 108 mg/dL — ABNORMAL HIGH (ref 70–99)

## 2010-05-06 LAB — URINALYSIS, ROUTINE W REFLEX MICROSCOPIC
Ketones, ur: NEGATIVE mg/dL
Nitrite: NEGATIVE
Protein, ur: NEGATIVE mg/dL
Urobilinogen, UA: 0.2 mg/dL (ref 0.0–1.0)

## 2010-05-06 LAB — CBC
HCT: 39 % (ref 36.0–46.0)
MCV: 90.9 fL (ref 78.0–100.0)
RBC: 4.29 MIL/uL (ref 3.87–5.11)
WBC: 6.7 10*3/uL (ref 4.0–10.5)

## 2010-05-06 LAB — HCG, QUANTITATIVE, PREGNANCY: hCG, Beta Chain, Quant, S: 264 m[IU]/mL — ABNORMAL HIGH (ref <5)

## 2010-05-06 LAB — ABO/RH: ABO/RH(D): A POS

## 2010-05-06 LAB — WET PREP, GENITAL: Yeast Wet Prep HPF POC: NONE SEEN

## 2010-05-06 LAB — POCT PREGNANCY, URINE: Preg Test, Ur: POSITIVE

## 2010-06-06 NOTE — Op Note (Signed)
Olivia Lawson, Olivia Lawson                 ACCOUNT NO.:  [id number]   MEDICAL RECORD NO.:  [id number]          PATIENT TYPE:  AMB   LOCATION:  [facility]                          FACILITY:  [facility]   PHYSICIAN:  Ginger Carne, MD  DATE OF BIRTH:  23-Jun-1985   DATE OF PROCEDURE:  11/28/2004  DATE OF DISCHARGE:                                 OPERATIVE REPORT   PREOPERATIVE DIAGNOSIS:  Incomplete abortion.   POSTOPERATIVE DIAGNOSIS:  Incomplete abortion, first trimester.   PROCEDURE:  Aspiration, dilatation and curettage.   SURGEON:  Ginger Carne, M.D.   ASSISTANT:  None.   COMPLICATIONS:  None immediate.   ESTIMATED BLOOD LOSS:  Minimal.   SPECIMEN:  Products of conception.   FINDINGS:  External genitalia, vulva and vagina normal.  Cervix smooth  without erosions or lesions.  Uterus sounded to approximately 8 cm.  Products of conception noted.  Cavity was smooth.  Both adnexa palpable and  found to be normal.   OPERATIVE PROCEDURE:  The patient prepped and draped in the usual fashion  and placed in the lithotomy position, Betadine solution used for antiseptic.  The patient was catheterized prior to the procedure.  Marcaine was utilized  circumferentially around the cervix for a paracervical block and a tenaculum  placed on the anterior lip of the cervix.  Dilatation to accommodate a #7  suction curette was followed by appropriate suctioning and at the end of the  procedure, no further products were obtained.  The patient returned to the  post anesthesia recovery room in excellent condition.      Ginger Carne, MD  Electronically Signed     SHB/MEDQ  D:  11/28/2004  T:  11/29/2004  Job:  [id number]

## 2010-06-06 NOTE — Discharge Summary (Signed)
Olivia Lawson, Olivia Lawson                 ACCOUNT NO.:  [id number]   MEDICAL RECORD NO.:  [id number]          PATIENT TYPE:  INP   LOCATION:  9105                          FACILITY:  [facility]   PHYSICIAN:  Phil D. Okey Dupre, M.D.     DATE OF BIRTH:  1985/10/12   DATE OF ADMISSION:  01/04/2006  DATE OF DISCHARGE:  01/07/2006                               DISCHARGE SUMMARY   ADMISSION DIAGNOSIS:  Early labor.   DISCHARGE DIAGNOSIS:  Postoperative day #3, lower cervical transverse  cesarean section for nonreassuring fetal heart tones.   HOSPITAL COURSE:  This is a 25 year old G2, P1, 0-1-1, who presented at  14 and 2 weeks to the MAU.  Initially, she labored on her own and was  admitted to L&D. As time went on, it was noted that some bleeding  occurred, especially with contractions.  Also, she had some  nonreassuring fetal heart rates, dipping down into the 80s with slow  return.  After a number of late decelerations with late return, it was  determined we take her for a C-section.  See operative note for details;  however, it was a low cervical transverse cesarean section, which  produced a viable female infant with Apgars of 9 and 9, cord pH of 7.27.  Of note, an abruption was noted during the OR period.  No complications.  See operative note again for details.   Postoperatively, the patient did well with no complications, although  she did have a postoperative fever on postop day #0, T max of 102.2.  She has remained afebrile since December 17th at 2200.  Initially, she  was on antibiotics; however, these were discontinued once she remained  afebrile for greater than 24 hours.   On the day of discharge, she is much improved, afebrile, with no  complaints.  The patient declines contraception until her follow-up  appointment at six weeks.  I did encourage her use to OCP's, however,  she continued to decline these.  Patient is breast-feeding.  Lactation  will be consulted.   DISCHARGE MEDICATIONS:   Patient was discharged with Percocet 5/325 1 tab  p.o. q.6h. p.r.n. pain, dispense #20, ibuprofen 600 mg 1 tab p.o. q.6h.  p.r.n. pain, Colace 100 mg 1 tab p.o. b.i.d. p.r.n. constipation,  prenatal vitamins 1 tab p.o. daily.   DISCHARGE INSTRUCTIONS:  Patient will follow up at [facility] health  department in six weeks for routine followup.  Please note that staples  will be removed today prior to discharge on postop day #3.  The incision  site  looks great.  The patient is A+, antibody negative.  Rubella nonimmune,  so therefore the patient will need an MMR prior to discharge.   DISCHARGE CONDITION:  Improved.     ______________________________  Johney Maine, M.D.    ______________________________  Javier Glazier. Okey Dupre, M.D.    JT/MEDQ  D:  01/07/2006  T:  01/07/2006  Job:  [id number]

## 2010-06-06 NOTE — Op Note (Signed)
Olivia Lawson, CHISOM NO.:  [id number]   MEDICAL RECORD NO.:  [id number]          PATIENT TYPE:  WOC   LOCATION:  [facility]                          FACILITY:  [facility]   PHYSICIAN:  Lesly Dukes, M.D. DATE OF BIRTH:  07-09-1985   DATE OF PROCEDURE:  12/31/2005  DATE OF DISCHARGE:                               OPERATIVE REPORT   PREOPERATIVE DIAGNOSIS:  1. A 41-plus-week intrauterine pregnancy.  2. Fetal intolerance to labor.  3. Suspect abruption.  4. Meconium.   POSTOPERATIVE DIAGNOSIS:  1. A 41-plus-week intrauterine pregnancy.  2. Fetal intolerance to labor.  3. Suspect abruption.  4. Meconium.   PROCEDURE:  Primary low transverse cesarean section via Pfannenstiel  skin incision.   SURGEON:  Lesly Dukes, M.D.   ASSISTANT:  Marc Morgans. Mayford Knife, M.D.   ANESTHESIA:  Epidural.   ESTIMATED BLOOD LOSS:  600 mL   IV FLUIDS:  2500 mL of lactated Ringer's.   URINE OUTPUT:  200 mL of clear urine.   COMPLICATIONS:  None.   SPECIMENS:  Placenta to pathology.  Cord blood to lab.  Cord gas to  NICU.   INDICATIONS:  A 25 year old G2 P0-0-1-0 at 10 and 2 weeks intrauterine  pregnancy presented in early labor.  The patient was scheduled for  induction this evening.  She was augmented with Pitocin.  She had  developed vaginal bleeding and had repetitive deceleration.  The patient  was counseled on the risks and benefits; and wanted to proceed with  cesarean section.   FINDINGS:  A female infant in the occiput posterior presentation.  Amniotic fluid was blood-tinged Apgars 9 and 9, pH 7.27 (a venous gas),  normal uterus, tubes and ovaries.   DESCRIPTION OF PROCEDURE:  The patient was taken to the operating room  where epidural anesthesia was found to be adequate.  She was prepped and  draped in a normal sterile fashion in a dorsal supine position with a  leftward tilt.  A Pfannenstiel skin incision was made with the scalpel  and carried through to the  underlying layer of fascia which was nicked  in the midline; and the incision extended with the Mayo scissors.  The  superior aspect of the fascial incision was grasped with Kocher clamps,  tented, and cut with Mayo scissors.  The inferior aspect of the incision  was grasped with Kocher clamps, tented, and the rectus muscles were  dissected off with the Mayo scissors.  The rectus muscles were separated  in the midline and perineum identified, tented up, and entered sharply  with the Metzenbaum scissors.  Peritoneal incision was extended  superiorly and inferiorly with good visualization of the bladder.   Bladder blade was inserted and the vesicouterine perineum identified,  grasped with pickups, and entered sharply with Metzenbaum scissors.  Incision was extended laterally, and bladder flap created digitally.   Bladder blade was reinserted in the lower uterine segment, incised in a  transverse fashion with the scalpel.  Uterine incision was extended  laterally with bandage scissors.  Bladder blade was removed and the  infant's head  and body delivered atraumatically.  Nose and mouth were  bulb suctioned; and the cord clamped and cut.  It was handed off to  waiting neonatologist.  Cord blood was sent.   Placenta was removed manually, uterus was exteriorized and cleared of  all clot and debris.  Placenta was sent to pathology.  Uterine incision  was repaired with #0 Vicryl in a running locked fashion.  A second layer  of __________ was used to imbricate.  Excellent hemostasis was obtained,  and the gutters were cleared of clots.  Fascia was closed with #0  Vicryl.  Skin was closed with staples.   The patient tolerated the procedure well.  Sponge, lap, and needle  counts were correct x2.  Antibiotics were given at cord clamp.  The  patient was taken to the recovery room in stable condition.  There no  complications.     ______________________________  Marc Morgans Mayford Knife, M.D.     ______________________________  Lesly Dukes, M.D.    TLW/MEDQ  D:  01/04/2006  T:  01/04/2006  Job:  [id number]

## 2010-08-28 ENCOUNTER — Encounter (HOSPITAL_COMMUNITY): Payer: Self-pay | Admitting: *Deleted

## 2010-08-28 ENCOUNTER — Inpatient Hospital Stay (HOSPITAL_COMMUNITY)
Admission: AD | Admit: 2010-08-28 | Discharge: 2010-08-30 | DRG: 775 | Disposition: A | Discharge status: Home / Self Care | Payer: Managed Care, Other (non HMO) | Source: Ambulatory Visit | Attending: Obstetrics and Gynecology | Admitting: Obstetrics and Gynecology

## 2010-08-28 DIAGNOSIS — O9903 Anemia complicating the puerperium: Secondary | ICD-10-CM | POA: Diagnosis not present

## 2010-08-28 DIAGNOSIS — O34219 Maternal care for unspecified type scar from previous cesarean delivery: Principal | ICD-10-CM | POA: Diagnosis present

## 2010-08-28 DIAGNOSIS — D649 Anemia, unspecified: Secondary | ICD-10-CM | POA: Diagnosis not present

## 2010-08-28 HISTORY — DX: Premature separation of placenta, unspecified, unspecified trimester: O45.90

## 2010-08-28 LAB — CBC
HCT: 31.6 % — ABNORMAL LOW (ref 36.0–46.0)
Hemoglobin: 10.5 g/dL — ABNORMAL LOW (ref 12.0–15.0)
MCHC: 33.2 g/dL (ref 30.0–36.0)
RBC: 3.77 MIL/uL — ABNORMAL LOW (ref 3.87–5.11)

## 2010-08-28 MED ORDER — ONDANSETRON HCL 4 MG/2ML IJ SOLN: 4.0000 mg | Freq: Four times a day (QID) | INTRAMUSCULAR | Status: DC | PRN

## 2010-08-28 MED ORDER — OXYTOCIN BOLUS FROM INFUSION
500.0000 mL | Freq: Once | INTRAVENOUS | Status: DC
  Filled 2010-08-28: qty 500

## 2010-08-28 MED ORDER — DIPHENHYDRAMINE HCL 25 MG PO CAPS
25.0000 mg | ORAL_CAPSULE | Freq: Four times a day (QID) | ORAL | Status: DC | PRN
  Administered 2010-08-30: 25 mg via ORAL

## 2010-08-28 MED ORDER — PHENYLEPHRINE 40 MCG/ML (10ML) SYRINGE FOR IV PUSH (FOR BLOOD PRESSURE SUPPORT): 80.0000 ug | PREFILLED_SYRINGE | INTRAVENOUS | Status: DC | PRN

## 2010-08-28 MED ORDER — LACTATED RINGERS IV SOLN
500.0000 mL | INTRAVENOUS | Status: DC | PRN
  Administered 2010-08-28 (×2): 1000 mL via INTRAVENOUS

## 2010-08-28 MED ORDER — CITRIC ACID-SODIUM CITRATE 334-500 MG/5ML PO SOLN: 30.0000 mL | ORAL | Status: DC | PRN

## 2010-08-28 MED ORDER — PHENYLEPHRINE 40 MCG/ML (10ML) SYRINGE FOR IV PUSH (FOR BLOOD PRESSURE SUPPORT)
80.0000 ug | PREFILLED_SYRINGE | INTRAVENOUS | Status: DC | PRN
  Filled 2010-08-28: qty 5

## 2010-08-28 MED ORDER — IBUPROFEN 600 MG PO TABS: 600.0000 mg | ORAL_TABLET | Freq: Four times a day (QID) | ORAL | Status: DC | PRN

## 2010-08-28 MED ORDER — ZOLPIDEM TARTRATE 10 MG PO TABS: 10.0000 mg | ORAL_TABLET | Freq: Every evening | ORAL | Status: DC | PRN

## 2010-08-28 MED ORDER — OXYCODONE-ACETAMINOPHEN 5-325 MG PO TABS
1.0000 | ORAL_TABLET | ORAL | Status: DC | PRN
  Administered 2010-08-28: 2 via ORAL
  Administered 2010-08-29: 1 via ORAL
  Administered 2010-08-29: 2 via ORAL
  Filled 2010-08-28 (×2): qty 2
  Filled 2010-08-28: qty 1

## 2010-08-28 MED ORDER — EPHEDRINE 5 MG/ML INJ: 10.0000 mg | INTRAVENOUS | Status: DC | PRN

## 2010-08-28 MED ORDER — LACTATED RINGERS IV SOLN: 500.0000 mL | Freq: Once | INTRAVENOUS | Status: DC

## 2010-08-28 MED ORDER — SENNOSIDES-DOCUSATE SODIUM 8.6-50 MG PO TABS
2.0000 | ORAL_TABLET | Freq: Every day | ORAL | Status: DC
  Administered 2010-08-28 – 2010-08-29 (×2): 2 via ORAL

## 2010-08-28 MED ORDER — ONDANSETRON HCL 4 MG/2ML IJ SOLN: 4.0000 mg | INTRAMUSCULAR | Status: DC | PRN

## 2010-08-28 MED ORDER — MAGNESIUM HYDROXIDE 400 MG/5ML PO SUSP: 30.0000 mL | ORAL | Status: DC | PRN

## 2010-08-28 MED ORDER — MEASLES, MUMPS & RUBELLA VAC ~~LOC~~ INJ: 0.5000 mL | INJECTION | Freq: Once | SUBCUTANEOUS | Status: DC

## 2010-08-28 MED ORDER — IBUPROFEN 600 MG PO TABS
600.0000 mg | ORAL_TABLET | Freq: Four times a day (QID) | ORAL | Status: DC
  Administered 2010-08-28 – 2010-08-30 (×7): 600 mg via ORAL
  Filled 2010-08-28 (×9): qty 1

## 2010-08-28 MED ORDER — WITCH HAZEL-GLYCERIN EX PADS: 1.0000 "application " | MEDICATED_PAD | CUTANEOUS | Status: DC | PRN

## 2010-08-28 MED ORDER — ZOLPIDEM TARTRATE 5 MG PO TABS: 5.0000 mg | ORAL_TABLET | Freq: Every evening | ORAL | Status: DC | PRN

## 2010-08-28 MED ORDER — SODIUM CHLORIDE 0.9 % IV SOLN: 250.0000 mL | INTRAVENOUS | Status: DC

## 2010-08-28 MED ORDER — BENZOCAINE-MENTHOL 20-0.5 % EX AERO: 1.0000 "application " | INHALATION_SPRAY | CUTANEOUS | Status: DC | PRN

## 2010-08-28 MED ORDER — TERBUTALINE SULFATE 1 MG/ML IJ SOLN: 0.2500 mg | Freq: Once | INTRAMUSCULAR | Status: DC | PRN

## 2010-08-28 MED ORDER — TETANUS-DIPHTH-ACELL PERTUSSIS 5-2.5-18.5 LF-MCG/0.5 IM SUSP
0.5000 mL | Freq: Once | INTRAMUSCULAR | Status: AC
  Administered 2010-08-29: 0.5 mL via INTRAMUSCULAR
  Filled 2010-08-28: qty 0.5

## 2010-08-28 MED ORDER — ONDANSETRON HCL 4 MG PO TABS: 4.0000 mg | ORAL_TABLET | ORAL | Status: DC | PRN

## 2010-08-28 MED ORDER — OXYTOCIN 20 UNITS IN LACTATED RINGERS INFUSION - SIMPLE
1.0000 m[IU]/min | INTRAVENOUS | Status: DC
  Administered 2010-08-28: 1 m[IU]/min via INTRAVENOUS

## 2010-08-28 MED ORDER — PRENATAL PLUS 27-1 MG PO TABS
1.0000 | ORAL_TABLET | Freq: Every day | ORAL | Status: DC
  Administered 2010-08-29 – 2010-08-30 (×2): 1 via ORAL
  Filled 2010-08-28 (×2): qty 1

## 2010-08-28 MED ORDER — DIBUCAINE 1 % RE OINT: 1.0000 "application " | TOPICAL_OINTMENT | RECTAL | Status: DC | PRN

## 2010-08-28 MED ORDER — OXYCODONE-ACETAMINOPHEN 5-325 MG PO TABS: 2.0000 | ORAL_TABLET | ORAL | Status: DC | PRN

## 2010-08-28 MED ORDER — FENTANYL CITRATE 0.05 MG/ML IJ SOLN: 100.0000 ug | INTRAMUSCULAR | Status: DC | PRN

## 2010-08-28 MED ORDER — LANOLIN HYDROUS EX OINT: TOPICAL_OINTMENT | CUTANEOUS | Status: DC | PRN

## 2010-08-28 MED ORDER — SIMETHICONE 80 MG PO CHEW: 80.0000 mg | CHEWABLE_TABLET | ORAL | Status: DC | PRN

## 2010-08-28 MED ORDER — FENTANYL 2.5 MCG/ML BUPIVACAINE 1/10 % EPIDURAL INFUSION (WH - ANES)
14.0000 mL/h | INTRAMUSCULAR | Status: DC
  Filled 2010-08-28: qty 60

## 2010-08-28 MED ORDER — SODIUM CHLORIDE 0.9 % IJ SOLN
3.0000 mL | Freq: Two times a day (BID) | INTRAMUSCULAR | Status: DC
  Administered 2010-08-28: 3 mL via INTRAVENOUS

## 2010-08-28 MED ORDER — EPHEDRINE 5 MG/ML INJ
10.0000 mg | INTRAVENOUS | Status: DC | PRN
  Filled 2010-08-28: qty 4

## 2010-08-28 MED ORDER — OXYTOCIN 10 UNIT/ML IJ SOLN: 10.0000 [IU] | Freq: Once | INTRAMUSCULAR | Status: DC

## 2010-08-28 MED ORDER — SODIUM CHLORIDE 0.9 % IJ SOLN: 3.0000 mL | INTRAMUSCULAR | Status: DC | PRN

## 2010-08-28 MED ORDER — OXYTOCIN 20 UNITS IN LACTATED RINGERS INFUSION - SIMPLE
125.0000 mL/h | INTRAVENOUS | Status: DC
  Filled 2010-08-28: qty 1000

## 2010-08-28 MED ORDER — LIDOCAINE HCL (PF) 1 % IJ SOLN
30.0000 mL | INTRAMUSCULAR | Status: DC | PRN
  Filled 2010-08-28: qty 30

## 2010-08-28 MED ORDER — DIPHENHYDRAMINE HCL 50 MG/ML IJ SOLN: 12.5000 mg | INTRAMUSCULAR | Status: DC | PRN

## 2010-08-28 MED ORDER — OXYTOCIN 20 UNITS IN LACTATED RINGERS INFUSION - SIMPLE: 125.0000 mL/h | INTRAVENOUS | Status: DC | PRN

## 2010-08-28 MED ORDER — ACETAMINOPHEN 325 MG PO TABS: 650.0000 mg | ORAL_TABLET | ORAL | Status: DC | PRN

## 2010-08-28 NOTE — Progress Notes (Signed)
Pt states contracting all night-here for a labor check-note husband and doula present

## 2010-08-28 NOTE — Progress Notes (Signed)
  Subjective: UCs irregular, widely-spaced, but painful.  Resting in bed at present, awaiting light breakfast.  Viviana Simpler, doula, at bedside.  Objective: BP 115/74  Pulse 82  Temp(Src) 97.7 F (36.5 C) (Oral)  Resp 18  Ht 5\' 6"  (1.676 m)  Wt 92.987 kg (205 lb)  BMI 33.09 kg/m2      FHT:  FHR: 140 bpm, variability: moderate,  accelerations:  Present,  decelerations:  Absent.  Fetus in a more active phase now. UC:   irregular, every 9-12 minutes  Labs: Lab Results  Component Value Date   WBC 5.5 12/25/2009   HGB 12.7 12/25/2009   HCT 37.0 12/25/2009   MCV 90.5 12/25/2009   PLT 290 12/25/2009    Assessment / Plan: Latent phase labor, prior C/S with subsequent VBAC 40 5/7 weeks Favorable cervix Reviewed status with patient and doula.  Recommend re-evaluation later this am for AROM. Patient plans to use birth tub. Will d/c monitoring at present while patient has light breakfast, then re-apply for 15-20 min eval. Patient agreeable with plan. Will place saline lock.   Herley Bernardini L 08/28/2010, 8:11 AM

## 2010-08-28 NOTE — Progress Notes (Signed)
Labor started at 2345.  G4p2.  40.5wks

## 2010-08-28 NOTE — H&P (Signed)
Olivia Lawson is a 25 y.o. female presenting for onset of ctx, that were apart at home. Pt denies VB or LOF, +FM. Pt desires waterbirth, is here with doula.  Maternal Medical History:  Reason for admission: Reason for admission: contractions.  Contractions: Onset was 3-5 hours ago.   Frequency: regular.   Perceived severity is moderate.    Fetal activity: Perceived fetal activity is normal.   Last perceived fetal movement was within the past hour.      OB History    Grav Para Term Preterm Abortions TAB SAB Ect Mult Living   5 2 2  1  1   2      Past Medical History  Diagnosis Date  . Placental abruption   . Migraine    Past Surgical History  Procedure Date  . Cesarean section    Family History: family history is not on file. Fam HX of CHTN, Dibetes, Anemia, Asthma, RA Social History:  reports that she has never smoked. She does not have any smokeless tobacco history on file. She reports that she does not drink alcohol or use illicit drugs.  Review of Systems  All other systems reviewed and are negative.    Dilation: 3 Effacement (%): 80 Station: -1 Exam by:: S Reisa Coppola CNM Blood pressure 111/69, pulse 67, temperature 98 F (36.7 C), temperature source Oral, resp. rate 20, height 5\' 6"  (1.676 m), weight 92.987 kg (205 lb). Maternal Exam:  Uterine Assessment: Contraction strength is moderate.  Contraction frequency is irregular.   Abdomen: Surgical scars: low transverse.   Fundal height is aga.   Estimated fetal weight is 7-7.   Fetal presentation: vertex  Introitus: Normal vulva. Normal vagina.    Fetal Exam Fetal Monitor Review: Mode: ultrasound.   Baseline rate: 120.  Variability: moderate (6-25 bpm).   Pattern: no accelerations.    Fetal State Assessment: Category II - tracings are indeterminate.     Physical Exam  Constitutional: She is oriented to person, place, and time. She appears well-developed and well-nourished.  Cardiovascular: Normal rate  and regular rhythm.   Respiratory: Effort normal and breath sounds normal.  GI: Soft.  Genitourinary: Vagina normal and uterus normal.  Neurological: She is alert and oriented to person, place, and time.  Skin: Skin is warm and dry.  Psychiatric: She has a normal mood and affect. Her behavior is normal. Judgment and thought content normal.    Prenatal labs: ABO, Rh: A POS (12/07 1940) Antibody: Negative (02/13 0000) Rubella:  immune RPR:   NR HBsAg: Negative (02/13 0000)  HIV: Non-reactive (02/13 0000)  GBS: Negative (07/10 0000)  1hr Gtt - NL Declined genetic screens   Assessment/Plan:  Early labor Desires VBAC GBS Neg FHR Cat II, will continue to observe  Admit to birthing suites, routine CNM orders,   Will discuss with Dr. Evlyn Kanner M 08/28/2010, 6:27 AM

## 2010-08-28 NOTE — Progress Notes (Signed)
  Subjective: Occasional UCs.  Rested, took shower, ate light meal.  Objective: BP 115/70  Pulse 63  Temp(Src) 98.1 F (36.7 C) (Oral)  Resp 18  Ht 5\' 6"  (1.676 m)  Wt 92.987 kg (205 lb)  BMI 33.09 kg/m2      FHT:  FHR: 130s bpm, variability: moderate,  accelerations:  Present,  decelerations:  Present :  One mild variable with UC UC:   Irregular, mild/mod Cervix 5, 60%, vtx, -2, not well-applied, membranes bulging, cx very soft.  Labs: Lab Results  Component Value Date   WBC 5.0 08/28/2010   HGB 10.5* 08/28/2010   HCT 31.6* 08/28/2010   MCV 83.8 08/28/2010   PLT 242 08/28/2010    Assessment / Plan: Reviewed status with patient, husband, and doula.  I recommend pitocin due to high position of vtx.  Patient agreeable with plan.  Reviewed again R&B of utilizing pitocin with previous C/S, but with benefit of subsequent VBAC.   Dr. Stefano Gaul updated.  Kendall Justo L 08/28/2010, 12:26 PM

## 2010-08-29 LAB — CBC
HCT: 30.2 % — ABNORMAL LOW (ref 36.0–46.0)
MCV: 83.4 fL (ref 78.0–100.0)
Platelets: 238 10*3/uL (ref 150–400)
RBC: 3.62 MIL/uL — ABNORMAL LOW (ref 3.87–5.11)
RDW: 14.1 % (ref 11.5–15.5)
WBC: 6.8 10*3/uL (ref 4.0–10.5)

## 2010-08-29 NOTE — Progress Notes (Signed)
Post Partum Day 1 Subjective: Very sleepy, baby feeding frequently.  Objective: Blood pressure 105/68, pulse 67, temperature 98.3 F (36.8 C), temperature source Oral, resp. rate 18, height 5\' 6"  (1.676 m), weight 92.987 kg (205 lb), SpO2 99.00%, unknown if currently breastfeeding.  Physical Exam:  General: fatigued Lochia: appropriate Uterine Fundus: firm Incision: No lacerations DVT Evaluation: No evidence of DVT seen on physical exam.   Basename 08/29/10 0520 08/28/10 0925  HGB 9.9* 10.5*  HCT 30.2* 31.6*    Assessment/Plan: Plan for discharge tomorrow Reviewed common feeding behaviors.    LOS: 1 day   Yazlyn Wentzel L 08/29/2010, 8:38 AM

## 2010-08-29 NOTE — Progress Notes (Signed)
UR chart review completed.  

## 2010-08-30 ENCOUNTER — Inpatient Hospital Stay (HOSPITAL_COMMUNITY): Admission: RE | Admit: 2010-08-30 | Payer: Self-pay | Source: Ambulatory Visit

## 2010-08-30 MED ORDER — IBUPROFEN 600 MG PO TABS: 600.0000 mg | ORAL_TABLET | Freq: Four times a day (QID) | ORAL | Status: AC

## 2010-08-30 MED ORDER — OXYCODONE-ACETAMINOPHEN 5-325 MG PO TABS: 1.0000 | ORAL_TABLET | Freq: Four times a day (QID) | ORAL | Status: AC | PRN

## 2010-08-30 NOTE — Progress Notes (Signed)
Post Partum Day 2 Subjective: no complaints.  Ambulating, voiding and tolerating po liquids and solids without difficulty.  Pos flatus, neg BM.  Breastfeeding without difficulty.  Reports some abd cramping relieved with ibuprofen and prn percocet.  Denies weakness or dizziness.   Objective: Blood pressure 113/66, pulse 66, temperature 98.4 F (36.9 C), temperature source Oral, resp. rate 20, height 5\' 6"  (1.676 m), weight 92.987 kg (205 lb), SpO2 99.00%, unknown if currently breastfeeding.  Physical Exam:  General: alert, cooperative and no distress Breasts:  Soft Heart:  RRR Lungs:  Clear to A/P auscultation  Abdomen:  Soft, non-tender with bowel sounds present x 4 quads.   Lochia: appropriate Uterine Fundus: firm, non-tender 1 below umbilicus Incision: N/A DVT Evaluation: No evidence of DVT seen on physical exam. Negative Homan's sign bilaterally Extrems:  Neg edema.  DTRs 1+, no clonus.     Basename 08/29/10 0520 08/28/10 0925  HGB 9.9* 10.5*  HCT 30.2* 31.6*    Assessment/Plan: Term IUP - delivered VBAC Asymptomatic postpartum anemia  Discharge home RTO in 6wks for f/u Discharge instructions reviewed. Undecided regarding contraception and RBA methods discussed.  Pt will discuss at 6wk pp visit.     LOS: 2 days   Phat Dalton O. 08/30/2010, 1:59 PM

## 2010-08-30 NOTE — Progress Notes (Signed)
Exp BF mom x2, Reviewed engorgement tx if needed . Per mom nipples tender ,at consult worked on obtaining  depth at the breast . Per mom intermittent dicomfort . LC recommended switching positions every feeding to aide in healing.

## 2010-08-30 NOTE — Discharge Summary (Signed)
Obstetric Discharge Summary Reason for Admission: onset of labor Prenatal Procedures: ultrasound Intrapartum Procedures: spontaneous vaginal delivery Postpartum Procedures: none Complications-Operative and Postpartum: none Hemoglobin  Date Value Range Status  08/29/2010 9.9* 12.0-15.0 (g/dL) Final     HCT  Date Value Range Status  08/29/2010 30.2* 36.0-46.0 (%) Final    Discharge Diagnoses: Term Pregnancy-delivered  Discharge Information: Date: 08/30/2010 Activity: unrestricted Diet: routine Medications: PNV, Ibuprophen and Percocet Condition: stable Instructions: refer to practice specific booklet Discharge to: home Follow-up Information    Follow up with [facility]. Make an appointment in 6 weeks.         Newborn Data: Live born female  Birth Weight: 6 lb 8.9 oz (2975 g) APGAR: 8, 9  Home with mother.  Olivia Shehan O. 08/30/2010, 2:06 PM

## 2011-06-23 ENCOUNTER — Other Ambulatory Visit: Payer: Self-pay | Admitting: Family Medicine

## 2011-07-02 ENCOUNTER — Ambulatory Visit (INDEPENDENT_AMBULATORY_CARE_PROVIDER_SITE_OTHER): Payer: BC Managed Care – PPO | Admitting: Emergency Medicine

## 2011-07-02 VITALS — BP 116/65 | HR 55 | Temp 98.3°F | Resp 16 | Ht 66.0 in | Wt 203.2 lb

## 2011-07-02 DIAGNOSIS — N926 Irregular menstruation, unspecified: Secondary | ICD-10-CM

## 2011-07-02 DIAGNOSIS — J40 Bronchitis, not specified as acute or chronic: Secondary | ICD-10-CM

## 2011-07-02 DIAGNOSIS — J018 Other acute sinusitis: Secondary | ICD-10-CM

## 2011-07-02 LAB — POCT URINALYSIS DIPSTICK
Blood, UA: NEGATIVE
Glucose, UA: NEGATIVE
Ketones, UA: 15
Spec Grav, UA: 1.025

## 2011-07-02 LAB — POCT UA - MICROSCOPIC ONLY
Bacteria, U Microscopic: NEGATIVE
RBC, urine, microscopic: NEGATIVE

## 2011-07-02 LAB — POCT URINE PREGNANCY: Preg Test, Ur: NEGATIVE

## 2011-07-02 MED ORDER — AMOXICILLIN-POT CLAVULANATE 875-125 MG PO TABS: 1.0000 | ORAL_TABLET | Freq: Two times a day (BID) | ORAL | Status: AC

## 2011-07-02 NOTE — Progress Notes (Signed)
Subjective:    Patient ID: Olivia Lawson, female    DOB: Nov 01, 1985, 26 y.o.   MRN: [id number]  Sore Throat  This is a new problem. The current episode started in the past 7 days. The problem has been unchanged. Neither side of throat is experiencing more pain than the other. The maximum temperature recorded prior to her arrival was 100 - 100.9 F. The pain is at a severity of 3/10. Associated symptoms include congestion, coughing (productive green sputum), headaches and a hoarse voice. Pertinent negatives include no diarrhea, drooling, ear discharge, ear pain, plugged ear sensation, neck pain, shortness of breath, stridor, swollen glands, trouble swallowing or vomiting. She has had exposure to strep. She has tried acetaminophen for the symptoms. The treatment provided no relief.  Sinusitis This is a new problem. The current episode started in the past 7 days. The problem has been gradually worsening since onset. The maximum temperature recorded prior to her arrival was 100 - 100.9 F. Her pain is at a severity of 3/10. Associated symptoms include congestion, coughing (productive green sputum), headaches, a hoarse voice, sinus pressure and a sore throat. Pertinent negatives include no chills, diaphoresis, ear pain, neck pain, shortness of breath, sneezing or swollen glands. Past treatments include oral decongestants. The treatment provided no relief.      Review of Systems  Constitutional: Negative for chills and diaphoresis.  HENT: Positive for congestion, sore throat, hoarse voice and sinus pressure. Negative for ear pain, sneezing, drooling, trouble swallowing, neck pain and ear discharge.   Eyes: Negative.   Respiratory: Positive for cough (productive green sputum). Negative for shortness of breath, wheezing and stridor.   Cardiovascular: Negative for chest pain and leg swelling.  Gastrointestinal: Negative.  Negative for vomiting and diarrhea.  Genitourinary: Negative.   Musculoskeletal:  Negative.   Neurological: Positive for headaches.       Objective:   Physical Exam  Constitutional: She is oriented to person, place, and time. She appears well-developed. She appears distressed.  HENT:  Head: Normocephalic and atraumatic.  Right Ear: External ear normal.  Left Ear: External ear normal.  Nose: Mucosal edema present. Right sinus exhibits maxillary sinus tenderness. Left sinus exhibits maxillary sinus tenderness.  Mouth/Throat: Posterior oropharyngeal erythema present.  Eyes: Conjunctivae are normal. Pupils are equal, round, and reactive to light.  Neck: Normal range of motion.  Cardiovascular: Normal rate and regular rhythm.   Pulmonary/Chest: Effort normal and breath sounds normal.  Abdominal: Soft.  Musculoskeletal: Normal range of motion.  Neurological: She is alert and oriented to person, place, and time.  Skin: Skin is warm and dry.          Assessment & Plan:   Results for orders placed in visit on 07/02/11  POCT UA - MICROSCOPIC ONLY      Component Value Range   WBC, Ur, HPF, POC 0-1     RBC, urine, microscopic neg     Bacteria, U Microscopic neg     Mucus, UA trace     Epithelial cells, urine per micros 0-3     Crystals, Ur, HPF, POC neg     Casts, Ur, LPF, POC neg     Yeast, UA neg    POCT URINALYSIS DIPSTICK      Component Value Range   Color, UA yellow     Clarity, UA clear     Glucose, UA neg     Bilirubin, UA neg     Ketones, UA 15  Spec Grav, UA 1.025     Blood, UA neg     pH, UA 6.5     Protein, UA neg     Urobilinogen, UA 0.2     Nitrite, UA neg     Leukocytes, UA Negative    POCT URINE PREGNANCY      Component Value Range   Preg Test, Ur Negative      Follow up with family physician for contraceptive management

## 2011-07-02 NOTE — Patient Instructions (Addendum)

## 2011-07-03 ENCOUNTER — Telehealth: Payer: Self-pay

## 2011-07-03 MED ORDER — P-D NATAL PLUS 0.8 MG PO TABS: 1.0000 | ORAL_TABLET | Freq: Every day | ORAL | Status: DC

## 2011-07-03 NOTE — Telephone Encounter (Signed)
Rx Prenatal Plus faxed to pharm on file.

## 2011-08-17 ENCOUNTER — Other Ambulatory Visit: Payer: Self-pay | Admitting: Family Medicine

## 2011-08-17 DIAGNOSIS — N63 Unspecified lump in unspecified breast: Secondary | ICD-10-CM

## 2011-08-24 ENCOUNTER — Other Ambulatory Visit (HOSPITAL_COMMUNITY)
Admission: RE | Admit: 2011-08-24 | Discharge: 2011-08-24 | Disposition: A | Discharge status: Home / Self Care | Payer: BC Managed Care – PPO | Source: Ambulatory Visit | Attending: Family Medicine | Admitting: Family Medicine

## 2011-08-24 ENCOUNTER — Other Ambulatory Visit: Payer: Self-pay | Admitting: Family Medicine

## 2011-08-24 ENCOUNTER — Ambulatory Visit
Admission: RE | Admit: 2011-08-24 | Discharge: 2011-08-24 | Disposition: A | Discharge status: Home / Self Care | Payer: BC Managed Care – PPO | Source: Ambulatory Visit | Attending: Family Medicine | Admitting: Family Medicine

## 2011-08-24 DIAGNOSIS — Z124 Encounter for screening for malignant neoplasm of cervix: Secondary | ICD-10-CM | POA: Insufficient documentation

## 2011-08-24 DIAGNOSIS — N63 Unspecified lump in unspecified breast: Secondary | ICD-10-CM

## 2011-09-30 ENCOUNTER — Emergency Department (HOSPITAL_COMMUNITY): Payer: No Typology Code available for payment source

## 2011-09-30 ENCOUNTER — Encounter (HOSPITAL_COMMUNITY): Payer: Self-pay | Admitting: Emergency Medicine

## 2011-09-30 ENCOUNTER — Emergency Department (HOSPITAL_COMMUNITY)
Admission: EM | Admit: 2011-09-30 | Discharge: 2011-09-30 | Disposition: A | Discharge status: Home / Self Care | Payer: No Typology Code available for payment source | Attending: Emergency Medicine | Admitting: Emergency Medicine

## 2011-09-30 DIAGNOSIS — S161XXA Strain of muscle, fascia and tendon at neck level, initial encounter: Secondary | ICD-10-CM

## 2011-09-30 DIAGNOSIS — M549 Dorsalgia, unspecified: Secondary | ICD-10-CM | POA: Insufficient documentation

## 2011-09-30 DIAGNOSIS — R072 Precordial pain: Secondary | ICD-10-CM | POA: Insufficient documentation

## 2011-09-30 DIAGNOSIS — S139XXA Sprain of joints and ligaments of unspecified parts of neck, initial encounter: Secondary | ICD-10-CM | POA: Insufficient documentation

## 2011-09-30 DIAGNOSIS — M542 Cervicalgia: Secondary | ICD-10-CM | POA: Insufficient documentation

## 2011-09-30 DIAGNOSIS — S239XXA Sprain of unspecified parts of thorax, initial encounter: Secondary | ICD-10-CM | POA: Insufficient documentation

## 2011-09-30 DIAGNOSIS — R51 Headache: Secondary | ICD-10-CM | POA: Insufficient documentation

## 2011-09-30 DIAGNOSIS — S301XXA Contusion of abdominal wall, initial encounter: Secondary | ICD-10-CM | POA: Insufficient documentation

## 2011-09-30 MED ORDER — DIAZEPAM 5 MG PO TABS: 5.0000 mg | ORAL_TABLET | Freq: Four times a day (QID) | ORAL | Status: AC | PRN

## 2011-09-30 MED ORDER — IBUPROFEN 600 MG PO TABS: 600.0000 mg | ORAL_TABLET | Freq: Four times a day (QID) | ORAL | Status: AC | PRN

## 2011-09-30 MED ORDER — OXYCODONE-ACETAMINOPHEN 5-325 MG PO TABS: 1.0000 | ORAL_TABLET | Freq: Four times a day (QID) | ORAL | Status: AC | PRN

## 2011-09-30 MED ORDER — OXYCODONE-ACETAMINOPHEN 5-325 MG PO TABS
1.0000 | ORAL_TABLET | Freq: Once | ORAL | Status: AC
  Administered 2011-09-30: 1 via ORAL
  Filled 2011-09-30: qty 1
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
## 2011-11-05 IMAGING — US US OB COMP LESS 14 WK
1 series · 14 of 28 positions shown · non-contrast
Comparison: None.

CLINICAL DATA: Pelvic pain and spotting.

OBSTETRIC <14 WK US AND TRANSVAGINAL OB US
TECHNIQUE: Both transabdominal and transvaginal ultrasound
examinations were performed for complete evaluation of the
gestation as well as the maternal uterus, adnexal regions, and
pelvic cul-de-sac.  Transvaginal technique was performed to assess
early pregnancy.

[Series 1: us ob comp less 14 wks · 0.19mm/px · 14 of 57 slices shown]
[im 3/57]
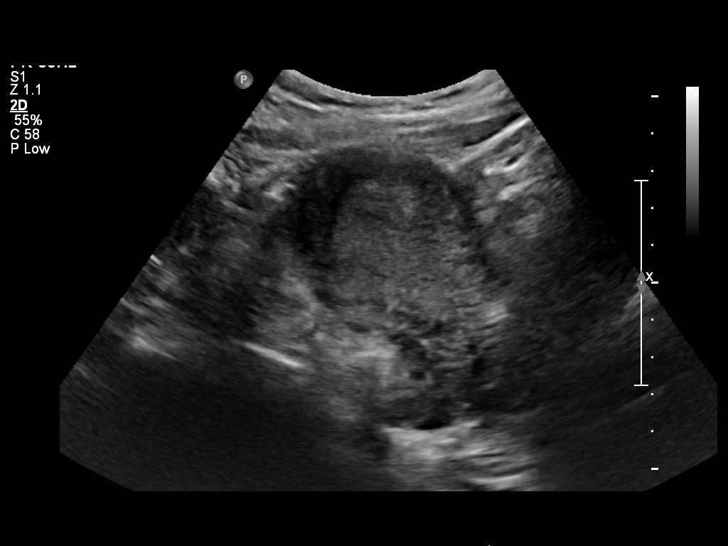
[im 7/57]
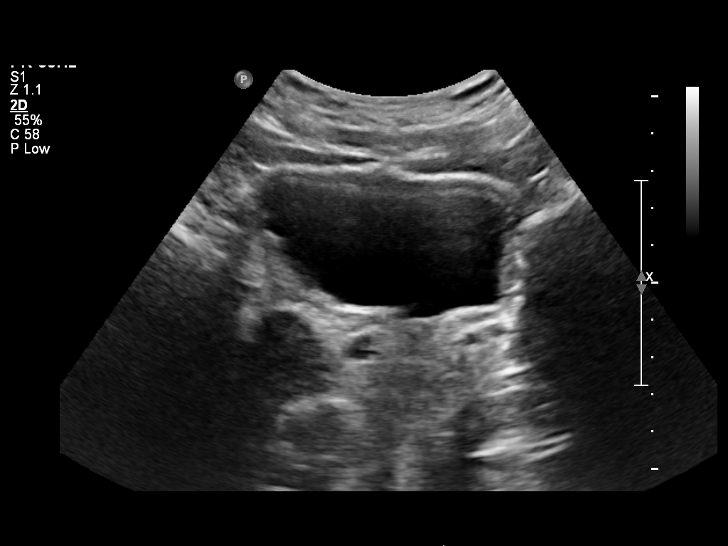
[im 11/57]
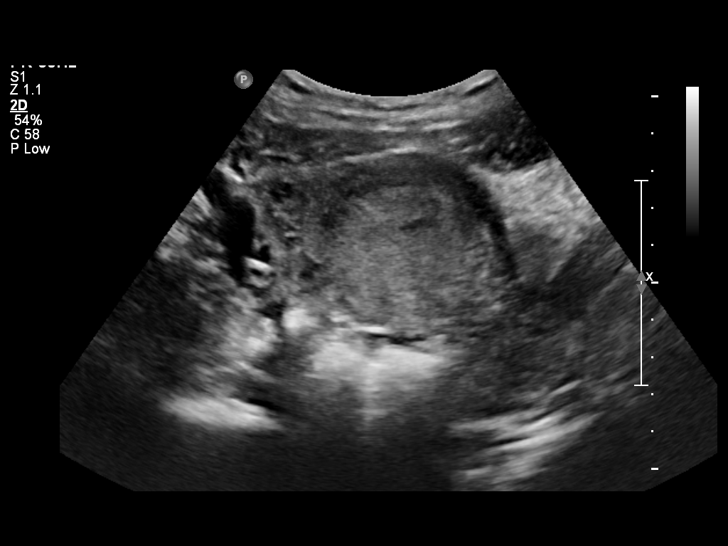
[im 15/57]
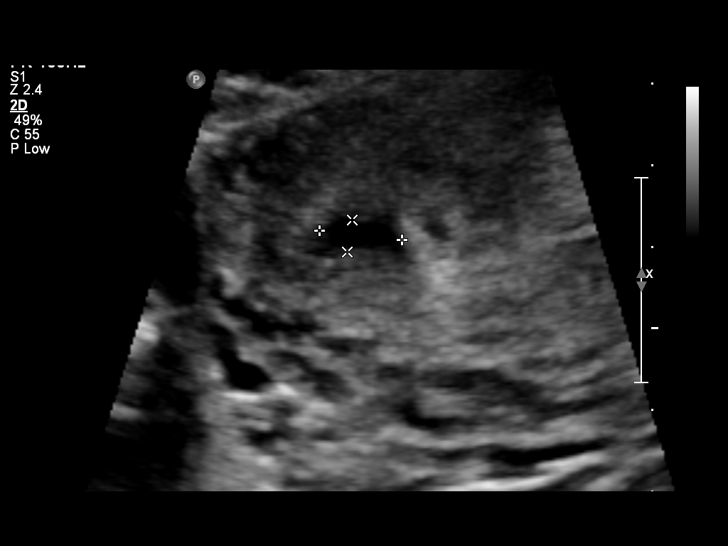
[im 19/57]
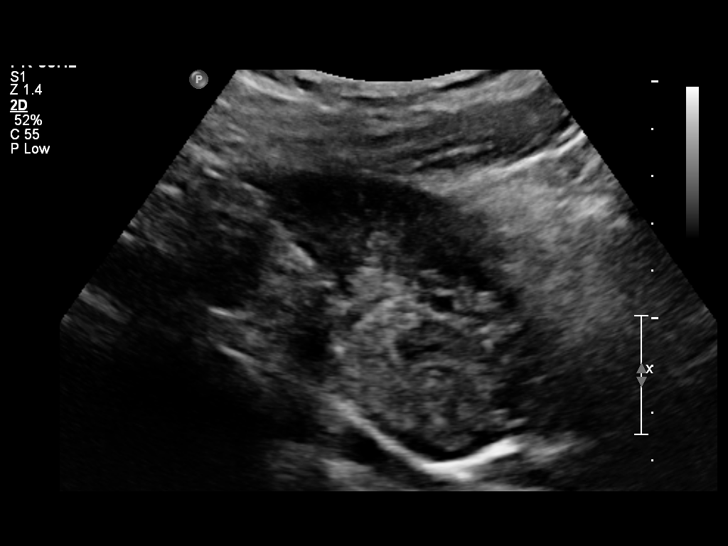
[im 23/57]
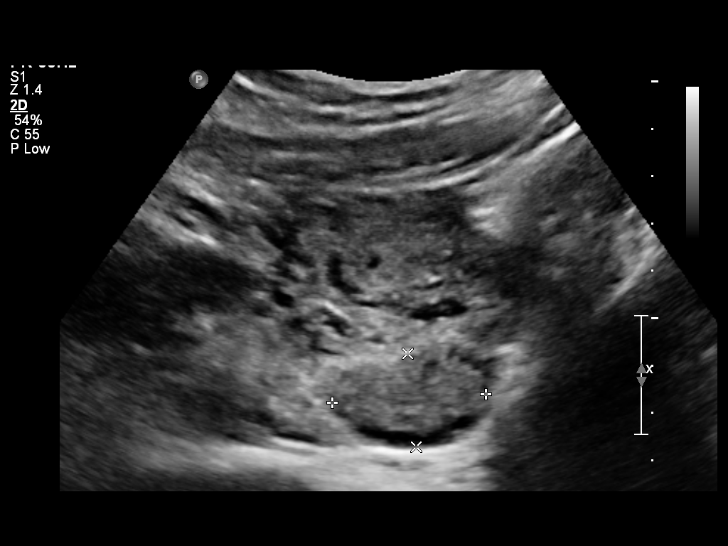
[im 27/57]
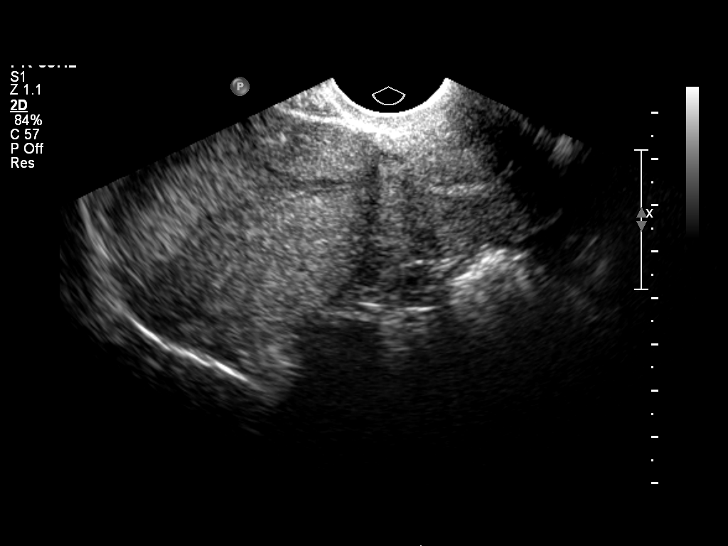
[im 32/57]
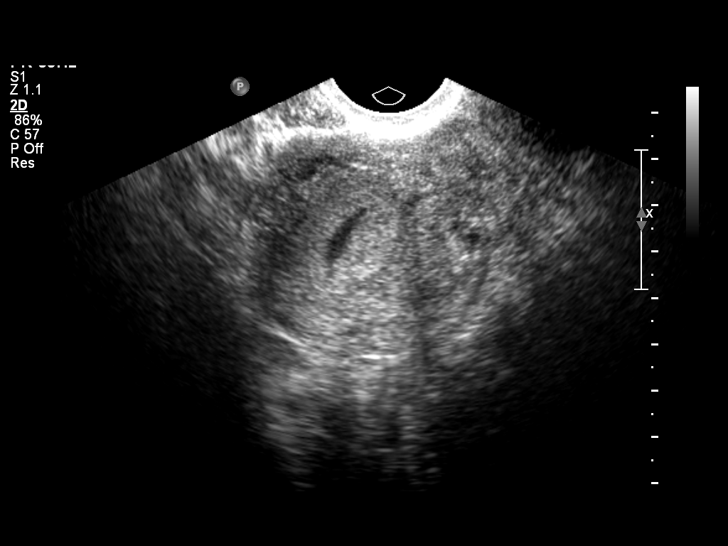
[im 36/57]
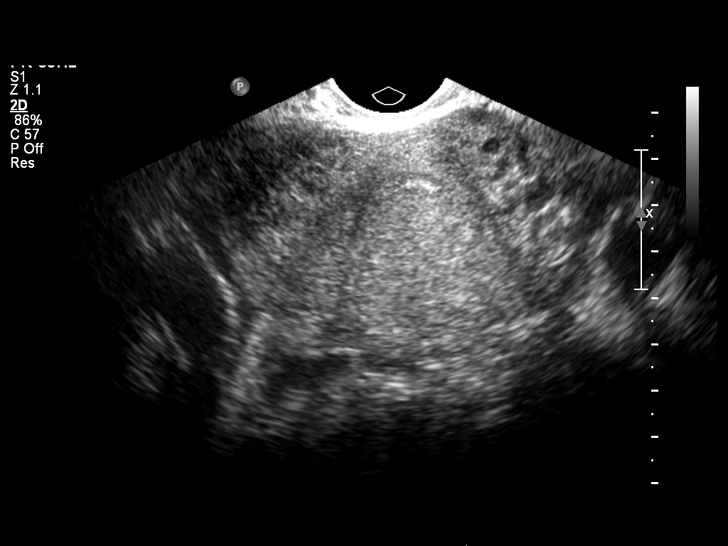
[im 40/57]
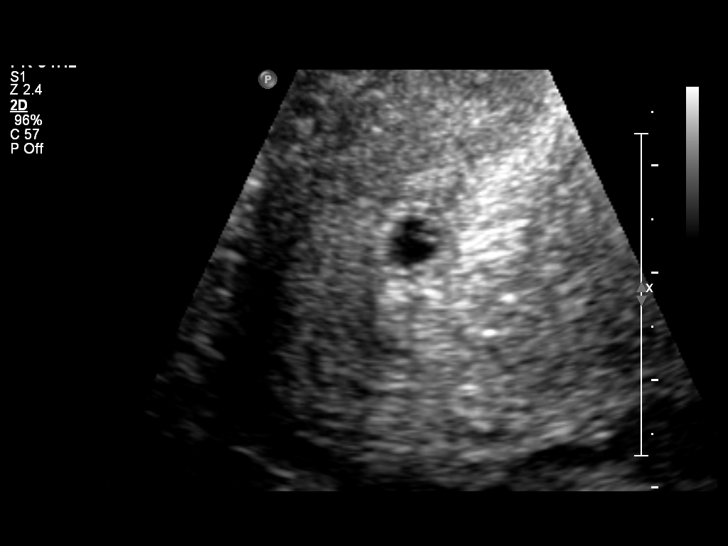
[im 44/57]
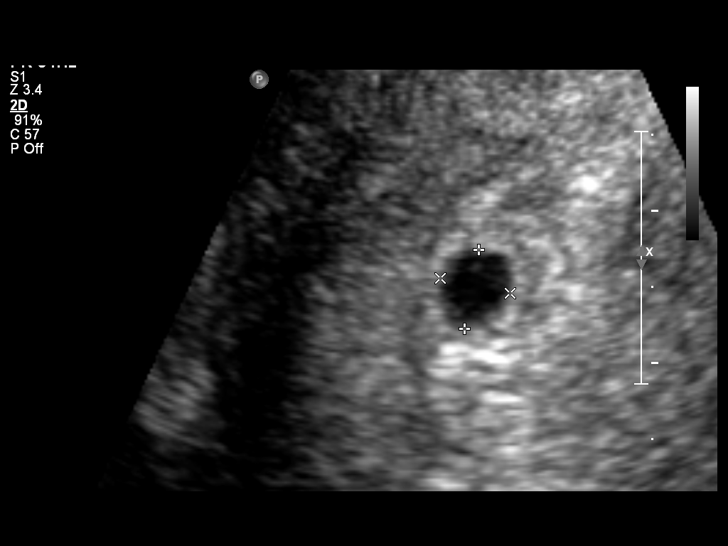
[im 48/57]
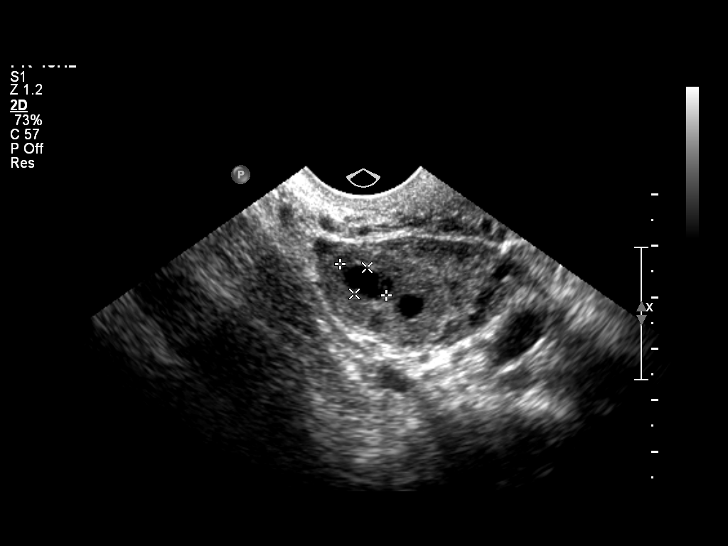
[im 52/57]
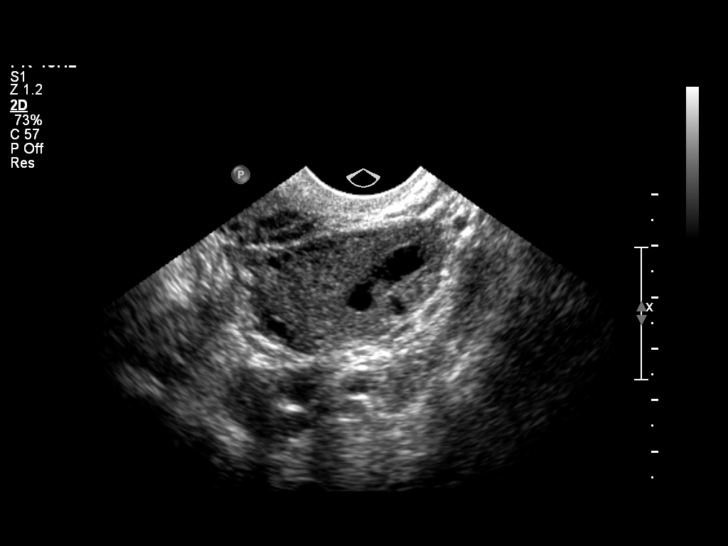
[im 57/57]
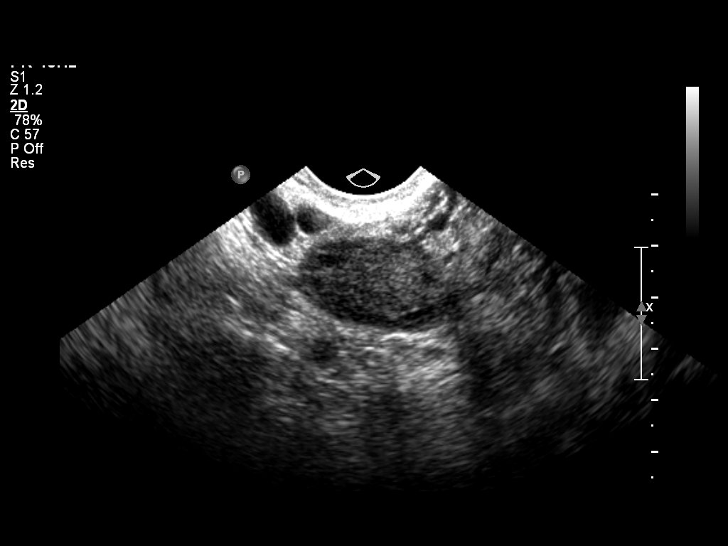

[14 of 28 positions shown; findings below may reference images not displayed]

Intrauterine gestational sac:  Visualized/normal in shape.
Yolk sac: Present
Embryo: None

MSD: 4.8 mm  5    w 4    d

Maternal uterus/adnexae:
Small subchorionic hemorrhage.
Normal ovaries.  Corpus luteum cyst on the left.
No free pelvic fluid collections.
IMPRESSION: 1.  Intrauterine gestational sac estimated at 5 weeks 4 days
gestation.
2.  No embryo is visualized.  A yolk sac is present.

## 2012-01-06 ENCOUNTER — Ambulatory Visit (INDEPENDENT_AMBULATORY_CARE_PROVIDER_SITE_OTHER): Payer: BC Managed Care – PPO | Admitting: Emergency Medicine

## 2012-01-06 VITALS — BP 144/78 | HR 151 | Temp 103.1°F | Resp 17 | Ht 66.5 in | Wt 208.0 lb

## 2012-01-06 DIAGNOSIS — J111 Influenza due to unidentified influenza virus with other respiratory manifestations: Secondary | ICD-10-CM

## 2012-01-06 MED ORDER — HYDROCOD POLST-CHLORPHEN POLST 10-8 MG/5ML PO LQCR
5.0000 mL | Freq: Two times a day (BID) | ORAL | Status: DC | PRN
Start: 2012-01-06 — End: 2012-06-18

## 2012-01-06 MED ORDER — OSELTAMIVIR PHOSPHATE 75 MG PO CAPS: 75.0000 mg | ORAL_CAPSULE | Freq: Two times a day (BID) | ORAL | Status: DC
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
## 2012-01-06 NOTE — Patient Instructions (Signed)

## 2012-02-15 ENCOUNTER — Ambulatory Visit: Payer: BC Managed Care – PPO | Admitting: Obstetrics and Gynecology

## 2012-02-15 ENCOUNTER — Encounter: Payer: Self-pay | Admitting: Obstetrics and Gynecology

## 2012-02-15 VITALS — BP 120/78 | Wt 209.0 lb

## 2012-02-15 DIAGNOSIS — Z8659 Personal history of other mental and behavioral disorders: Secondary | ICD-10-CM

## 2012-02-15 NOTE — Progress Notes (Signed)
Pt in today for a note for school stating she was having difficulty with pregnancy during spring semester in 2012. Delivered 08/28/10.   Note written and mailed to patient  S.Ryo Klang, CNM

## 2012-02-22 ENCOUNTER — Encounter: Payer: Self-pay | Admitting: Obstetrics and Gynecology

## 2012-02-23 ENCOUNTER — Encounter: Payer: Self-pay | Admitting: Obstetrics and Gynecology

## 2012-06-18 ENCOUNTER — Emergency Department (HOSPITAL_COMMUNITY)
Admission: EM | Admit: 2012-06-18 | Discharge: 2012-06-18 | Disposition: A | Discharge status: Home / Self Care | Payer: Self-pay | Attending: Emergency Medicine | Admitting: Emergency Medicine

## 2012-06-18 ENCOUNTER — Encounter (HOSPITAL_COMMUNITY): Payer: Self-pay | Admitting: *Deleted

## 2012-06-18 DIAGNOSIS — R531 Weakness: Secondary | ICD-10-CM

## 2012-06-18 DIAGNOSIS — H1011 Acute atopic conjunctivitis, right eye: Secondary | ICD-10-CM

## 2012-06-18 DIAGNOSIS — Z8742 Personal history of other diseases of the female genital tract: Secondary | ICD-10-CM | POA: Insufficient documentation

## 2012-06-18 DIAGNOSIS — R5383 Other fatigue: Secondary | ICD-10-CM | POA: Insufficient documentation

## 2012-06-18 DIAGNOSIS — Z3202 Encounter for pregnancy test, result negative: Secondary | ICD-10-CM | POA: Insufficient documentation

## 2012-06-18 DIAGNOSIS — R5381 Other malaise: Secondary | ICD-10-CM | POA: Insufficient documentation

## 2012-06-18 DIAGNOSIS — H1030 Unspecified acute conjunctivitis, unspecified eye: Secondary | ICD-10-CM | POA: Insufficient documentation

## 2012-06-18 DIAGNOSIS — Z8679 Personal history of other diseases of the circulatory system: Secondary | ICD-10-CM | POA: Insufficient documentation

## 2012-06-18 LAB — CBC WITH DIFFERENTIAL/PLATELET
Basophils Absolute: 0 10*3/uL (ref 0.0–0.1)
HCT: 36.4 % (ref 36.0–46.0)
Hemoglobin: 12.2 g/dL (ref 12.0–15.0)
Lymphocytes Relative: 37 % (ref 12–46)
Lymphs Abs: 1.6 10*3/uL (ref 0.7–4.0)
MCV: 86.1 fL (ref 78.0–100.0)
Monocytes Absolute: 0.4 10*3/uL (ref 0.1–1.0)
Monocytes Relative: 8 % (ref 3–12)
Neutro Abs: 2.1 10*3/uL (ref 1.7–7.7)
RBC: 4.23 MIL/uL (ref 3.87–5.11)
WBC: 4.3 10*3/uL (ref 4.0–10.5)

## 2012-06-18 LAB — URINALYSIS, ROUTINE W REFLEX MICROSCOPIC
Glucose, UA: NEGATIVE mg/dL
Hgb urine dipstick: NEGATIVE
Ketones, ur: 15 mg/dL — AB
pH: 5.5 (ref 5.0–8.0)

## 2012-06-18 LAB — URINE MICROSCOPIC-ADD ON

## 2012-06-18 LAB — BASIC METABOLIC PANEL
BUN: 11 mg/dL (ref 6–23)
CO2: 23 mEq/L (ref 19–32)
Chloride: 105 mEq/L (ref 96–112)
Creatinine, Ser: 0.77 mg/dL (ref 0.50–1.10)
Glucose, Bld: 90 mg/dL (ref 70–99)
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
## 2012-06-18 NOTE — ED Notes (Signed)
Pt c/o right eye pain, "feels like their something in it" and fatigue that started yesterday

## 2012-10-14 ENCOUNTER — Emergency Department (HOSPITAL_COMMUNITY)
Admission: EM | Admit: 2012-10-14 | Discharge: 2012-10-14 | Disposition: A | Discharge status: Home / Self Care | Payer: Self-pay | Attending: Emergency Medicine | Admitting: Emergency Medicine

## 2012-10-14 ENCOUNTER — Encounter (HOSPITAL_COMMUNITY): Payer: Self-pay

## 2012-10-14 ENCOUNTER — Emergency Department (HOSPITAL_COMMUNITY): Payer: Self-pay

## 2012-10-14 DIAGNOSIS — S92901A Unspecified fracture of right foot, initial encounter for closed fracture: Secondary | ICD-10-CM

## 2012-10-14 DIAGNOSIS — Y929 Unspecified place or not applicable: Secondary | ICD-10-CM | POA: Insufficient documentation

## 2012-10-14 DIAGNOSIS — S92501A Displaced unspecified fracture of right lesser toe(s), initial encounter for closed fracture: Secondary | ICD-10-CM

## 2012-10-14 DIAGNOSIS — S92919A Unspecified fracture of unspecified toe(s), initial encounter for closed fracture: Secondary | ICD-10-CM | POA: Insufficient documentation

## 2012-10-14 DIAGNOSIS — Z23 Encounter for immunization: Secondary | ICD-10-CM | POA: Insufficient documentation

## 2012-10-14 DIAGNOSIS — Y9389 Activity, other specified: Secondary | ICD-10-CM | POA: Insufficient documentation

## 2012-10-14 DIAGNOSIS — S92309A Fracture of unspecified metatarsal bone(s), unspecified foot, initial encounter for closed fracture: Secondary | ICD-10-CM | POA: Insufficient documentation

## 2012-10-14 DIAGNOSIS — Z8679 Personal history of other diseases of the circulatory system: Secondary | ICD-10-CM | POA: Insufficient documentation

## 2012-10-14 DIAGNOSIS — W208XXA Other cause of strike by thrown, projected or falling object, initial encounter: Secondary | ICD-10-CM | POA: Insufficient documentation

## 2012-10-14 MED ORDER — TETANUS-DIPHTH-ACELL PERTUSSIS 5-2.5-18.5 LF-MCG/0.5 IM SUSP
0.5000 mL | Freq: Once | INTRAMUSCULAR | Status: AC
  Administered 2012-10-14: 0.5 mL via INTRAMUSCULAR
  Filled 2012-10-14: qty 0.5

## 2012-10-14 MED ORDER — HYDROCODONE-ACETAMINOPHEN 5-325 MG PO TABS: 1.0000 | ORAL_TABLET | Freq: Four times a day (QID) | ORAL | Status: DC | PRN

## 2012-10-14 MED ORDER — HYDROCODONE-ACETAMINOPHEN 5-325 MG PO TABS
1.0000 | ORAL_TABLET | Freq: Once | ORAL | Status: AC
  Administered 2012-10-14: 1 via ORAL
  Filled 2012-10-14: qty 1

## 2012-10-14 MED ORDER — LIDOCAINE HCL 2 % IJ SOLN
10.0000 mL | Freq: Once | INTRAMUSCULAR | Status: AC
  Administered 2012-10-14: 200 mg via INTRADERMAL
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
## 2012-10-14 NOTE — Progress Notes (Signed)
Orthopedic Tech Progress Note Patient Details:  Olivia Lawson 1985-09-14 [id number] Applied post-op shoe to RLE.  Fitted crutches and taught pt. use of same. Ortho Devices Type of Ortho Device: Postop shoe/boot;Crutches Ortho Device/Splint Location: RLE Ortho Device/Splint Interventions: Application   [name] 10/14/2012, 4:26 PM
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
## 2012-10-15 NOTE — ED Provider Notes (Signed)
Medical screening examination/treatment/procedure(s) were performed by non-physician practitioner and as supervising physician I was immediately available for consultation/collaboration.   Million Maharaj, MD 10/15/12 0814 

## 2013-05-10 ENCOUNTER — Emergency Department (HOSPITAL_COMMUNITY)
Admission: EM | Admit: 2013-05-10 | Discharge: 2013-05-11 | Disposition: A | Discharge status: Home / Self Care | Payer: BC Managed Care – PPO | Attending: Emergency Medicine | Admitting: Emergency Medicine

## 2013-05-10 DIAGNOSIS — M62838 Other muscle spasm: Secondary | ICD-10-CM

## 2013-05-10 DIAGNOSIS — Z8679 Personal history of other diseases of the circulatory system: Secondary | ICD-10-CM | POA: Insufficient documentation

## 2013-05-11 ENCOUNTER — Encounter (HOSPITAL_COMMUNITY): Payer: Self-pay | Admitting: Emergency Medicine

## 2013-05-11 LAB — I-STAT CHEM 8, ED
BUN: 16 mg/dL (ref 6–23)
CHLORIDE: 104 meq/L (ref 96–112)
Calcium, Ion: 1.24 mmol/L — ABNORMAL HIGH (ref 1.12–1.23)
Creatinine, Ser: 0.8 mg/dL (ref 0.50–1.10)
GLUCOSE: 102 mg/dL — AB (ref 70–99)
HEMATOCRIT: 37 % (ref 36.0–46.0)
Hemoglobin: 12.6 g/dL (ref 12.0–15.0)
POTASSIUM: 4 meq/L (ref 3.7–5.3)
Sodium: 140 mEq/L (ref 137–147)
TCO2: 26 mmol/L (ref 0–100)

## 2013-05-11 MED ORDER — DIAZEPAM 5 MG PO TABS: 5.0000 mg | ORAL_TABLET | Freq: Two times a day (BID) | ORAL | Status: DC

## 2013-05-11 MED ORDER — NAPROXEN 500 MG PO TABS: 500.0000 mg | ORAL_TABLET | Freq: Two times a day (BID) | ORAL | Status: DC

## 2013-05-11 MED ORDER — NAPROXEN 500 MG PO TABS
500.0000 mg | ORAL_TABLET | Freq: Once | ORAL | Status: AC
  Administered 2013-05-11: 500 mg via ORAL
  Filled 2013-05-11: qty 1

## 2013-05-11 MED ORDER — DIAZEPAM 5 MG PO TABS
5.0000 mg | ORAL_TABLET | Freq: Once | ORAL | Status: AC
  Administered 2013-05-11: 5 mg via ORAL
  Filled 2013-05-11: qty 1

## 2013-05-11 NOTE — ED Provider Notes (Signed)
Medical screening examination/treatment/procedure(s) were performed by non-physician practitioner and as supervising physician I was immediately available for consultation/collaboration.   EKG Interpretation None       Olivia Mackielga M Fradel Baldonado, MD 05/11/13 2041

## 2013-05-11 NOTE — ED Notes (Signed)
Pt reports leg cramping to L leg that started yesterday. Pt denies any injury to leg but reports also feeling fatigue and that she finds knots in leg that she has to massage. Pt alert and ambulatory in triage area.

## 2013-05-11 NOTE — ED Provider Notes (Signed)
CSN: [id number]     Arrival date & time 05/10/13  2343 History   First MD Initiated Contact with Patient 05/11/13 0054     Chief Complaint  Patient presents with  . Leg Pain    (Consider location/radiation/quality/duration/timing/severity/associated sxs/prior Treatment) HPI Comments: Patient is a 28 year old female who presents to the emergency department for bilateral thigh cramping. Patient states that her left thigh is worse in her right thigh and a cramping has been intermittent since yesterday. Patient endorses a history of the same, relieved by muscle relaxers. Patient states that she is active on a regular basis and exercises frequently. She states that cramping is associated with spasms which is worse with palpation. She states that sometimes massage improves her symptoms, but only if she is able to fully massage her thigh until symptoms resolve. Patient states she took Tylenol for her symptoms without relief. She denies fevers, trauma/injury, numbness/tingling, weakness, claudication, lower extremity swelling, and recent surgeries or hospitalizations.  Patient is a 28 y.o. female presenting with leg pain. The history is provided by the patient. No language interpreter was used.  Leg Pain Associated symptoms: no fever     Past Medical History  Diagnosis Date  . Placental abruption   . Migraine    Past Surgical History  Procedure Laterality Date  . Cesarean section    . Hernia repair    . Dental surgery     History reviewed. No pertinent family history. History  Substance Use Topics  . Smoking status: Never Smoker   . Smokeless tobacco: Not on file  . Alcohol Use: No   OB History   Grav Para Term Preterm Abortions TAB SAB Ect Mult Living   4 3 3  0 1 0 1 0 0 3     Review of Systems  Constitutional: Negative for fever.  Musculoskeletal: Positive for myalgias.  All other systems reviewed and are negative.     Allergies  Review of patient's allergies indicates no  known allergies.  Home Medications   Prior to Admission medications   Not on File   BP 115/63  Pulse 58  Temp(Src) 98.2 F (36.8 C) (Oral)  Resp 17  SpO2 100%  LMP 04/13/2013  Physical Exam  Nursing note and vitals reviewed. Constitutional: She is oriented to person, place, and time. She appears well-developed and well-nourished. No distress.  Nontoxic/nonseptic appearing.  HENT:  Head: Normocephalic and atraumatic.  Mouth/Throat: Oropharynx is clear and moist. No oropharyngeal exudate.  Eyes: Conjunctivae and EOM are normal. No scleral icterus.  Neck: Normal range of motion.  Cardiovascular: Normal rate, regular rhythm and intact distal pulses.   Pulses:      Dorsalis pedis pulses are 2+ on the right side, and 2+ on the left side.       Posterior tibial pulses are 2+ on the right side, and 2+ on the left side.  Pulmonary/Chest: Effort normal. No respiratory distress.  Musculoskeletal: Normal range of motion. She exhibits tenderness.       Right knee: Normal.       Left knee: Normal.       Right upper leg: Normal.       Left upper leg: She exhibits tenderness. She exhibits no bony tenderness, no swelling, no edema, no deformity and no laceration.       Right lower leg: Normal.       Left lower leg: Normal.       Legs: +TTP of L lateral thigh with spasm. No erythema,  heat to touch, or red streaking. Normal ROM of LLE.  Neurological: She is alert and oriented to person, place, and time. She exhibits normal muscle tone. Coordination normal.  No gross sensory deficits appreciated. Patient ambulatory with normal gait. Patellar and achilles reflexes 2+ b/l.  Skin: Skin is warm and dry. No rash noted. She is not diaphoretic. No erythema. No pallor.  Psychiatric: She has a normal mood and affect. Her behavior is normal.    ED Course  Procedures (including critical care time) Labs Review Labs Reviewed  I-STAT CHEM 8, ED - Abnormal; Notable for the following:    Glucose, Bld  102 (*)    Calcium, Ion 1.24 (*)    All other components within normal limits    Imaging Review No results found.   EKG Interpretation None      MDM   Final diagnoses:  Muscle spasm of left lower extremity    Patient presents for muscle status to her bilateral thighs, left worse than right. Patient has a history of same and states that symptoms feel similar to past exacerbations. Patient endorses frequent physical activity and exercise. She states that she has been taking Tylenol and drinking fluids without relief.  Patient is neurovascularly intact on physical exam. She ambulates with normal gait. No gross sensory deficits appreciated. Patient has no electrolyte imbalance today. She denies claudication. She exhibits no lower extremity edema or erythema. No palpable cords. Symptoms atypical for DVT; Patient has a Well's DVT score of 0. She is stable and appropriate for d/c with Rx for Naproxen and Valium. Return precautions discussed and provided. Patient agreeable to plan with no unaddressed concerns.   Filed Vitals:   05/10/13 2347  BP: 115/63  Pulse: 58  Temp: 98.2 F (36.8 C)  TempSrc: Oral  Resp: 17  SpO2: 100%     Antony MaduraKelly Arilla Hice, PA-C 05/11/13 0720

## 2013-05-11 NOTE — Discharge Instructions (Signed)
Your electrolytes tested today were normal. Drink plenty of water. Recommend naproxen and Valium as prescribed, as needed for symptoms. Followup with your primary care provider.  Muscle Cramps and Spasms Muscle cramps and spasms occur when a muscle or muscles tighten and you have no control over this tightening (involuntary muscle contraction). They are a common problem and can develop in any muscle. The most common place is in the calf muscles of the leg. Both muscle cramps and muscle spasms are involuntary muscle contractions, but they also have differences:   Muscle cramps are sporadic and painful. They may last a few seconds to a quarter of an hour. Muscle cramps are often more forceful and last longer than muscle spasms.  Muscle spasms may or may not be painful. They may also last just a few seconds or much longer. CAUSES  It is uncommon for cramps or spasms to be due to a serious underlying problem. In many cases, the cause of cramps or spasms is unknown. Some common causes are:   Overexertion.   Overuse from repetitive motions (doing the same thing over and over).   Remaining in a certain position for a long period of time.   Improper preparation, form, or technique while performing a sport or activity.   Dehydration.   Injury.   Side effects of some medicines.   Abnormally low levels of the salts and ions in your blood (electrolytes), especially potassium and calcium. This could happen if you are taking water pills (diuretics) or you are pregnant.  Some underlying medical problems can make it more likely to develop cramps or spasms. These include, but are not limited to:   Diabetes.   Parkinson disease.   Hormone disorders, such as thyroid problems.   Alcohol abuse.   Diseases specific to muscles, joints, and bones.   Blood vessel disease where not enough blood is getting to the muscles.  HOME CARE INSTRUCTIONS   Stay well hydrated. Drink enough water  and fluids to keep your urine clear or pale yellow.  It may be helpful to massage, stretch, and relax the affected muscle.  For tight or tense muscles, use a warm towel, heating pad, or hot shower water directed to the affected area.  If you are sore or have pain after a cramp or spasm, applying ice to the affected area may relieve discomfort.  Put ice in a plastic bag.  Place a towel between your skin and the bag.  Leave the ice on for 15-20 minutes, 03-04 times a day.  Medicines used to treat a known cause of cramps or spasms may help reduce their frequency or severity. Only take over-the-counter or prescription medicines as directed by your caregiver. SEEK MEDICAL CARE IF:  Your cramps or spasms get more severe, more frequent, or do not improve over time.  MAKE SURE YOU:   Understand these instructions.  Will watch your condition.  Will get help right away if you are not doing well or get worse. Document Released: 06/27/2001 Document Revised: 05/02/2012 Document Reviewed: 12/23/2011 [facility] Patient Information 2014 [place], [place].
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
## 2013-09-20 ENCOUNTER — Inpatient Hospital Stay (HOSPITAL_COMMUNITY)
Admission: AD | Admit: 2013-09-20 | Discharge: 2013-09-20 | Disposition: A | Discharge status: Home / Self Care | Payer: BC Managed Care – PPO | Source: Ambulatory Visit | Attending: Obstetrics & Gynecology | Admitting: Obstetrics & Gynecology

## 2013-09-20 ENCOUNTER — Encounter (HOSPITAL_COMMUNITY): Payer: Self-pay | Admitting: *Deleted

## 2013-09-20 DIAGNOSIS — B373 Candidiasis of vulva and vagina: Secondary | ICD-10-CM | POA: Insufficient documentation

## 2013-09-20 DIAGNOSIS — N898 Other specified noninflammatory disorders of vagina: Secondary | ICD-10-CM | POA: Diagnosis present

## 2013-09-20 DIAGNOSIS — B3731 Acute candidiasis of vulva and vagina: Secondary | ICD-10-CM

## 2013-09-20 LAB — URINALYSIS, ROUTINE W REFLEX MICROSCOPIC
Bilirubin Urine: NEGATIVE
GLUCOSE, UA: NEGATIVE mg/dL
Hgb urine dipstick: NEGATIVE
Ketones, ur: NEGATIVE mg/dL
LEUKOCYTES UA: NEGATIVE
NITRITE: NEGATIVE
PH: 5.5 (ref 5.0–8.0)
Protein, ur: NEGATIVE mg/dL
Urobilinogen, UA: 0.2 mg/dL (ref 0.0–1.0)

## 2013-09-20 LAB — POCT PREGNANCY, URINE: Preg Test, Ur: NEGATIVE

## 2013-09-20 LAB — WET PREP, GENITAL
TRICH WET PREP: NONE SEEN
YEAST WET PREP: NONE SEEN

## 2013-09-20 MED ORDER — NYSTATIN 100000 UNIT/GM EX CREA: TOPICAL_CREAM | CUTANEOUS | Status: DC

## 2013-09-20 MED ORDER — FLUCONAZOLE 150 MG PO TABS
150.0000 mg | ORAL_TABLET | Freq: Once | ORAL | Status: AC
  Administered 2013-09-20: 150 mg via ORAL
  Filled 2013-09-20: qty 1

## 2013-09-20 MED ORDER — TRIAMCINOLONE ACETONIDE 0.1 % EX CREA: 1.0000 "application " | TOPICAL_CREAM | Freq: Two times a day (BID) | CUTANEOUS | Status: DC

## 2013-09-20 MED ORDER — FLUCONAZOLE 150 MG PO TABS: 150.0000 mg | ORAL_TABLET | Freq: Every day | ORAL | Status: DC

## 2013-09-20 NOTE — Discharge Instructions (Signed)

## 2013-09-20 NOTE — MAU Provider Note (Signed)
History     CSN: [id number]  Arrival date and time: 09/20/13 2119   First Provider Initiated Contact with Patient 09/20/13 2248      No chief complaint on file.  HPI Olivia Lawson is a 28 y.o. [phone] who presents to MAU today with complaint of vaginal discharge and irritation since yesterday. She also endorses groin pain and possible lymphadenopathy. She states that she noted thick, white discharge and used monistat OTC with external vaginal cream. She states that symptoms have worsened and she feels a lot of vulvar irritation. She denies vaginal bleeding, fever or UTI symptoms.     OB History   Grav Para Term Preterm Abortions TAB SAB Ect Mult Living   0 1 0 1 0 0 3      Past Medical History  Diagnosis Date  . Placental abruption   . Migraine     Past Surgical History  Procedure Laterality Date  . Cesarean section    . Hernia repair    . Dental surgery      History reviewed. No pertinent family history.  History  Substance Use Topics  . Smoking status: Never Smoker   . Smokeless tobacco: Not on file  . Alcohol Use: No    Allergies: No Known Allergies  Prescriptions prior to admission  Medication Sig Dispense Refill  . diazepam (VALIUM) 5 MG tablet Take 1 tablet (5 mg total) by mouth 2 (two) times daily.  10 tablet  0  . naproxen (NAPROSYN) 500 MG tablet Take 1 tablet (500 mg total) by mouth 2 (two) times daily.  30 tablet  0    Review of Systems  Constitutional: Negative for fever and malaise/fatigue.  Gastrointestinal: Negative for abdominal pain.  Genitourinary: Negative for dysuria, urgency and frequency.       + vaginal discharge Neg - vaginal bleeding   Physical Exam   Blood pressure 122/66, pulse 71, temperature 98.8 F (37.1 C), temperature source Oral, resp. rate 16, height 5' 6.5" (1.689 m), weight 181 lb (82.101 kg), last menstrual period 08/06/2013.  Physical Exam  Constitutional: She is oriented to person, place, and time. She  appears well-developed and well-nourished. No distress.  HENT:  Head: Normocephalic.  Cardiovascular: Normal rate.   Respiratory: Effort normal.  GI: Soft. She exhibits no distension and no mass. There is no tenderness. There is no rebound and no guarding.  Genitourinary: There is rash (mild erythema) on the right labia. There is rash on the left labia. Cervix exhibits no discharge and no friability. No bleeding around the vagina. Vaginal discharge (copious amounts of thick, clumpy white discharge noted) found.  Neurological: She is alert and oriented to person, place, and time.  Skin: Skin is warm and dry. No erythema.  Psychiatric: She has a normal mood and affect.   Results for orders placed during the hospital encounter of 09/20/13 (from the past 24 hour(s))  URINALYSIS, ROUTINE W REFLEX MICROSCOPIC     Status: Abnormal   Collection Time    09/20/13 10:20 PM      Result Value Ref Range   Color, Urine YELLOW  YELLOW   APPearance CLEAR  CLEAR   Specific Gravity, Urine >1.030 (*) 1.005 - 1.030   pH 5.5  5.0 - 8.0   Glucose, UA NEGATIVE  NEGATIVE mg/dL   Hgb urine dipstick NEGATIVE  NEGATIVE   Bilirubin Urine NEGATIVE  NEGATIVE   Ketones, ur NEGATIVE  NEGATIVE mg/dL   Protein, ur NEGATIVE  NEGATIVE mg/dL   Urobilinogen, UA 0.2  0.0 - 1.0 mg/dL   Nitrite NEGATIVE  NEGATIVE   Leukocytes, UA NEGATIVE  NEGATIVE  POCT PREGNANCY, URINE     Status: None   Collection Time    09/20/13 10:31 PM      Result Value Ref Range   Preg Test, Ur NEGATIVE  NEGATIVE  WET PREP, GENITAL     Status: Abnormal   Collection Time    09/20/13 10:50 PM      Result Value Ref Range   Yeast Wet Prep HPF POC NONE SEEN  NONE SEEN   Trich, Wet Prep NONE SEEN  NONE SEEN   Clue Cells Wet Prep HPF POC FEW (*) NONE SEEN   WBC, Wet Prep HPF POC FEW (*) NONE SEEN     MAU Course  Procedures None  MDM UPT - negative UA, wet prep and GC/chalmydia today  Assessment and Plan  A: Yeast vulvovaginitis,  clinical  P: Discharge home Rx for Diflucan, Nystatin and Triamcinolone given to patient GC/Chlamydia culture pending Patient may return to MAU as needed or if her condition were to change or worsen   Marny Lowenstein, PA-C  09/20/2013, 11:24 PM

## 2013-09-20 NOTE — MAU Note (Signed)
Noticed white discharge yesterday and used one day monostat. Today is raw and noticed swollen lymph nodes and raised areas around vagina. Has been using OTC cream for these raised areas. Feels that OTC meds made her symptoms worse. No new sexual partners. Does have pain after urination in the raw area of her vagina not with urination.

## 2013-09-23 LAB — GC/CHLAMYDIA PROBE AMP
CT Probe RNA: NEGATIVE
GC Probe RNA: NEGATIVE

## 2013-11-20 ENCOUNTER — Encounter (HOSPITAL_COMMUNITY): Payer: Self-pay | Admitting: *Deleted

## 2013-12-20 ENCOUNTER — Inpatient Hospital Stay (HOSPITAL_COMMUNITY)
Admission: AD | Admit: 2013-12-20 | Discharge: 2013-12-20 | Disposition: A | Discharge status: Home / Self Care | Payer: BC Managed Care – PPO | Source: Ambulatory Visit | Attending: Obstetrics & Gynecology | Admitting: Obstetrics & Gynecology

## 2013-12-20 ENCOUNTER — Encounter (HOSPITAL_COMMUNITY): Payer: Self-pay

## 2013-12-20 DIAGNOSIS — R001 Bradycardia, unspecified: Secondary | ICD-10-CM | POA: Insufficient documentation

## 2013-12-20 DIAGNOSIS — R42 Dizziness and giddiness: Secondary | ICD-10-CM

## 2013-12-20 DIAGNOSIS — N898 Other specified noninflammatory disorders of vagina: Secondary | ICD-10-CM

## 2013-12-20 LAB — COMPREHENSIVE METABOLIC PANEL
ALT: 19 U/L (ref 0–35)
AST: 21 U/L (ref 0–37)
Albumin: 3.4 g/dL — ABNORMAL LOW (ref 3.5–5.2)
Alkaline Phosphatase: 57 U/L (ref 39–117)
Anion gap: 11 (ref 5–15)
BUN: 23 mg/dL (ref 6–23)
CALCIUM: 9.3 mg/dL (ref 8.4–10.5)
CO2: 25 meq/L (ref 19–32)
Chloride: 102 mEq/L (ref 96–112)
Creatinine, Ser: 0.72 mg/dL (ref 0.50–1.10)
GFR calc non Af Amer: >90 mL/min (ref >90)
GLUCOSE: 86 mg/dL (ref 70–99)
Potassium: 4.4 mEq/L (ref 3.7–5.3)
Sodium: 138 mEq/L (ref 137–147)
Total Bilirubin: 0.2 mg/dL — ABNORMAL LOW (ref 0.3–1.2)
Total Protein: 6.8 g/dL (ref 6.0–8.3)

## 2013-12-20 LAB — CBC
HCT: 39.1 % (ref 36.0–46.0)
Hemoglobin: 13.3 g/dL (ref 12.0–15.0)
MCH: 30.9 pg (ref 26.0–34.0)
MCHC: 34 g/dL (ref 30.0–36.0)
MCV: 90.7 fL (ref 78.0–100.0)
Platelets: 254 10*3/uL (ref 150–400)
RBC: 4.31 MIL/uL (ref 3.87–5.11)
RDW: 13 % (ref 11.5–15.5)
WBC: 5 10*3/uL (ref 4.0–10.5)

## 2013-12-20 LAB — URINALYSIS, ROUTINE W REFLEX MICROSCOPIC
Bilirubin Urine: NEGATIVE
Glucose, UA: NEGATIVE mg/dL
Hgb urine dipstick: NEGATIVE
Ketones, ur: NEGATIVE mg/dL
Leukocytes, UA: NEGATIVE
Nitrite: NEGATIVE
Protein, ur: NEGATIVE mg/dL
Specific Gravity, Urine: <1.005 — ABNORMAL LOW (ref 1.005–1.030)
Urobilinogen, UA: 0.2 mg/dL (ref 0.0–1.0)
pH: 5.5 (ref 5.0–8.0)

## 2013-12-20 LAB — WET PREP, GENITAL
Clue Cells Wet Prep HPF POC: NONE SEEN
Trich, Wet Prep: NONE SEEN
YEAST WET PREP: NONE SEEN

## 2013-12-20 LAB — POCT PREGNANCY, URINE: Preg Test, Ur: NEGATIVE

## 2013-12-20 LAB — HIV ANTIBODY (ROUTINE TESTING W REFLEX): HIV 1&2 Ab, 4th Generation: NONREACTIVE

## 2013-12-20 NOTE — MAU Provider Note (Signed)
CC: Possible Pregnancy; Dizziness; and Vaginal Discharge    First Provider Initiated Contact with Patient 12/20/13 1506      HPI Olivia Lawson is a 28 y.o. [id number] who presents with malodorous fishy smelling vaginal discharge. She has history of recurrent and refractory VB diagnosed at [facility]. She reports 4 episodes over the last couple of months. Treatments have included seven-day Flagyl course, 2 g one day Flagyl course, another 7-day Flagyl course, MetroGel cream Which she completed 12/14/2013. She also self treated with a Diflucan tablet that day. Also self treating with probiotics. Does not associate episodes with menstruation or intercourse. Married and in a mutually monogamous relationship for 10 years.  LMP 11/16/13. Denies abnormal bleeding or pelvic pain. Not using contraception because she does not believe in it. She is also describing frequent dizzy spells longstanding for years which sound postural in nature. Dizzy spells often associated with nausea. No vomiting. No headache. Denies syncope.  Not taking Valium.   Past Medical History  Diagnosis Date  . Placental abruption   . Migraine     OB History  Gravida Para Term Preterm AB SAB TAB Ectopic Multiple Living  4 3 3  0 1 1 0 0 0 3    # Outcome Date GA Lbr Len/2nd Weight Sex Delivery Anes PTL Lv  4 Term 08/28/10 [id number] 13:08 / 00:18 2.975 kg (6 lb 8.9 oz) M Vag-Spont None  Y  3 SAB           2 Term         Y  1 Term         Y      Past Surgical History  Procedure Laterality Date  . Cesarean section    . Hernia repair    . Dental surgery      History   Social History  . Marital Status: Married    Spouse Name: N/A    Number of Children: N/A  . Years of Education: N/A   Occupational History  . Not on file.   Social History Main Topics  . Smoking status: Never Smoker   . Smokeless tobacco: Not on file  . Alcohol Use: No  . Drug Use: No  . Sexual Activity: Yes    Birth Control/ Protection: None    Other Topics Concern  . Not on file   Social History Narrative    No current facility-administered medications on file prior to encounter.   Current Outpatient Prescriptions on File Prior to Encounter  Medication Sig Dispense Refill  . diazepam (VALIUM) 5 MG tablet Take 1 tablet (5 mg total) by mouth 2 (two) times daily. 10 tablet 0  . fluconazole (DIFLUCAN) 150 MG tablet Take 1 tablet (150 mg total) by mouth daily. 1 tablet 0  . naproxen (NAPROSYN) 500 MG tablet Take 1 tablet (500 mg total) by mouth 2 (two) times daily. 30 tablet 0  . nystatin cream (MYCOSTATIN) Apply to affected area 2 times daily 15 g 0  . triamcinolone cream (KENALOG) 0.1 % Apply 1 application topically 2 (two) times daily. 30 g 0    Allergies  Allergen Reactions  . Monistat [Miconazole] Swelling and Rash    ROS Pertinent items in HPI  PHYSICAL EXAM Filed Vitals:   12/20/13 1356  BP: 120/70  Pulse: 50  Temp: 98.9 F (37.2 C)  Resp: 16  Orthosttic VS: no significant changes    General: Well nourished, well developed female in no acute distress Cardiovascular:  Normal rate Respiratory: Normal effort Abdomen: Soft, nontender Back: No CVAT Extremities: No edema Neurologic: Alert and oriented Speculum exam: NEFG; vagina with whitish leukorrhea swabbed from vault and cx, no malodor noted; cervix clean Bimanual exam: cervix closed, no CMT; uterus NSSP; no adnexal tenderness or masses   LAB RESULTS Results for orders placed or performed during the hospital encounter of 12/20/13 (from the past 24 hour(s))  Urinalysis, Routine w reflex microscopic     Status: Abnormal   Collection Time: 12/20/13  2:00 PM  Result Value Ref Range   Color, Urine YELLOW YELLOW   APPearance CLEAR CLEAR   Specific Gravity, Urine <1.005 (L) 1.005 - 1.030   pH 5.5 5.0 - 8.0   Glucose, UA NEGATIVE NEGATIVE mg/dL   Hgb urine dipstick NEGATIVE NEGATIVE   Bilirubin Urine NEGATIVE NEGATIVE   Ketones, ur NEGATIVE NEGATIVE  mg/dL   Protein, ur NEGATIVE NEGATIVE mg/dL   Urobilinogen, UA 0.2 0.0 - 1.0 mg/dL   Nitrite NEGATIVE NEGATIVE   Leukocytes, UA NEGATIVE NEGATIVE  Pregnancy, urine POC     Status: None   Collection Time: 12/20/13  2:55 PM  Result Value Ref Range   Preg Test, Ur NEGATIVE NEGATIVE  Comprehensive metabolic panel     Status: Abnormal   Collection Time: 12/20/13  3:28 PM  Result Value Ref Range   Sodium 138 137 - 147 mEq/L   Potassium 4.4 3.7 - 5.3 mEq/L   Chloride 102 96 - 112 mEq/L   CO2 25 19 - 32 mEq/L   Glucose, Bld 86 70 - 99 mg/dL   BUN 23 6 - 23 mg/dL   Creatinine, Ser 0.980.72 0.50 - 1.10 mg/dL   Calcium 9.3 8.4 - 11.910.5 mg/dL   Total Protein 6.8 6.0 - 8.3 g/dL   Albumin 3.4 (L) 3.5 - 5.2 g/dL   AST 21 0 - 37 U/L   ALT 19 0 - 35 U/L   Alkaline Phosphatase 57 39 - 117 U/L   Total Bilirubin 0.2 (L) 0.3 - 1.2 mg/dL   GFR calc non Af Amer >90 >90 mL/min   GFR calc Af Amer >90 >90 mL/min   Anion gap 11 5 - 15  CBC     Status: None   Collection Time: 12/20/13  3:28 PM  Result Value Ref Range   WBC 5.0 4.0 - 10.5 K/uL   RBC 4.31 3.87 - 5.11 MIL/uL   Hemoglobin 13.3 12.0 - 15.0 g/dL   HCT 14.739.1 82.936.0 - 56.246.0 %   MCV 90.7 78.0 - 100.0 fL   MCH 30.9 26.0 - 34.0 pg   MCHC 34.0 30.0 - 36.0 g/dL   RDW 13.013.0 86.511.5 - 78.415.5 %   Platelets 254 150 - 400 K/uL  Wet prep, genital     Status: Abnormal   Collection Time: 12/20/13  3:35 PM  Result Value Ref Range   Yeast Wet Prep HPF POC NONE SEEN NONE SEEN   Trich, Wet Prep NONE SEEN NONE SEEN   Clue Cells Wet Prep HPF POC NONE SEEN NONE SEEN   WBC, Wet Prep HPF POC MANY (A) NONE SEEN    IMAGING No results found.  MAU COURSE EKG sinus bradycardia HR 51  ASSESSMENT  1. Vaginal leukorrhea   2. Dizziness     PLAN Discharge home. See AVS for patient education. Discussed means of normalizing vaginal pH. Explained that oral probiotics affects the gut and has not been shown to affect vaginal flora.  Called boric acid cap 600mg   pv 2x/wk to  [facility]    Medication List    STOP taking these medications        diazepam 5 MG tablet  Commonly known as:  VALIUM     fluconazole 150 MG tablet  Commonly known as:  DIFLUCAN     naproxen 500 MG tablet  Commonly known as:  NAPROSYN     nystatin cream  Commonly known as:  MYCOSTATIN     triamcinolone cream 0.1 %  Commonly known as:  KENALOG      TAKE these medications        multivitamin with minerals Tabs tablet  Take 1 tablet by mouth daily.     PROBIOTIC DAILY PO  Take by mouth.     Vitamin D3 2000 UNITS Tabs  Take by mouth.       Follow-up Information    Please follow up.   Why:  See list of primary care providers         Danae Orleans, CNM 12/20/2013 3:21 PM

## 2013-12-20 NOTE — MAU Note (Addendum)
Patient states she has had recurrent BV. States she has been treated multiple times but still has a discharge with an odor. States she has dizziness off and on for a long time. Denies bleeding, or vomiting. Has nausea off and on. Denies pain. Patient state she was told she needed a diabetic workup because of the recurrent BV and wants a complete workup in MAU.

## 2013-12-20 NOTE — Discharge Instructions (Signed)
Primary Care Resources: ° °- [facility]  °[address] [place] [address] [place] [phone] °Every 2nd Saturday 9am-12pm  °[email or web address] °FREE Services ° °- [facility]  °[address] [place] [phone]  °[address] [place] [address] [place] [phone]  °[email or web address] °$45 per visit/Walk-in only ° °- [facility]  °[address] [place] [address] [place] [phone]  °1st & 3rd Saturday of each month 9:30am-12:30pm °[email or web address] °Sliding fee scale/Call to make an appointment ° °- [facility]  °[address] [place] [phone]  °Hours Mon-Fri 9am-7pm & Sat 9am-1pm °[email or web address] °Visits start at $45 per visit/Call to make an appointment ° °- [facility] [place]  °[address] [place] [address] [place] [phone]  °Hours Mon-Wed 8:30am-5pm & Thurs 8:30am-8pm °$5 per visit/Call for an eligibility appointment ° ° ° °Bacterial Vaginosis °Bacterial vaginosis is a vaginal infection that occurs when the normal balance of bacteria in the vagina is disrupted. It results from an overgrowth of certain bacteria. This is the most common vaginal infection in women of childbearing age. Treatment is important to prevent complications, especially in pregnant women, as it can cause a premature delivery. °CAUSES  °Bacterial vaginosis is caused by an increase in harmful bacteria that are normally present in smaller amounts in the vagina. Several different kinds of bacteria can cause bacterial vaginosis. However, the reason that the condition develops is not fully understood. °RISK FACTORS °Certain activities or behaviors can put you at an increased risk of developing bacterial vaginosis, including: °· Having a new sex partner or multiple sex partners. °· Douching. °· Using an intrauterine device (IUD) for contraception. °Women do not get bacterial vaginosis from toilet seats,  bedding, swimming pools, or contact with objects around them. °SIGNS AND SYMPTOMS  °Some women with bacterial vaginosis have no signs or symptoms. Common symptoms include: °· Grey vaginal discharge. °· A fishlike odor with discharge, especially after sexual intercourse. °· Itching or burning of the vagina and vulva. °· Burning or pain with urination. °DIAGNOSIS  °Your health care provider will take a medical history and examine the vagina for signs of bacterial vaginosis. A sample of vaginal fluid may be taken. Your health care provider will look at this sample under a microscope to check for bacteria and abnormal cells. A vaginal pH test may also be done.  °TREATMENT  °Bacterial vaginosis may be treated with antibiotic medicines. These may be given in the form of a pill or a vaginal cream. A second round of antibiotics may be prescribed if the condition comes back after treatment.  °HOME CARE INSTRUCTIONS  °· Only take over-the-counter or prescription medicines as directed by your health care provider. °· If antibiotic medicine was prescribed, take it as directed. Make sure you finish it even if you start to feel better. °· Do not have sex until treatment is completed. °· Tell all sexual partners that you have a vaginal infection. They should see their health care provider and be treated if they have problems, such as a mild rash or itching. °· Practice safe sex by using condoms and only having one sex partner. °SEEK MEDICAL CARE IF:  °· Your symptoms are not improving after 3 days of treatment. °· You have increased discharge or pain. °· You have a fever. °MAKE SURE YOU:  °· Understand these instructions. °· Will watch your condition. °· Will get help right away if you   are not doing well or get worse. °FOR MORE INFORMATION  °Centers for Disease Control and Prevention, Division of STD Prevention: www.cdc.gov/std °American Sexual Health Association (ASHA): www.ashastd.org  °Document Released: 01/05/2005 Document  Revised: 10/26/2012 Document Reviewed: 08/17/2012 °ExitCare® Patient Information ©2015 [employer]. This information is not intended to replace advice given to you by your health care provider. Make sure you discuss any questions you have with your health care provider. ° °

## 2013-12-21 LAB — GC/CHLAMYDIA PROBE AMP
CT Probe RNA: NEGATIVE
GC Probe RNA: NEGATIVE

## 2014-02-04 ENCOUNTER — Encounter (HOSPITAL_COMMUNITY): Payer: Self-pay | Admitting: *Deleted

## 2014-02-04 ENCOUNTER — Inpatient Hospital Stay (HOSPITAL_COMMUNITY)
Admission: AD | Admit: 2014-02-04 | Discharge: 2014-02-04 | Disposition: A | Discharge status: Home / Self Care | Payer: Self-pay | Source: Ambulatory Visit | Attending: Obstetrics & Gynecology | Admitting: Obstetrics & Gynecology

## 2014-02-04 DIAGNOSIS — B373 Candidiasis of vulva and vagina: Secondary | ICD-10-CM

## 2014-02-04 DIAGNOSIS — B379 Candidiasis, unspecified: Secondary | ICD-10-CM | POA: Insufficient documentation

## 2014-02-04 DIAGNOSIS — B3731 Acute candidiasis of vulva and vagina: Secondary | ICD-10-CM

## 2014-02-04 LAB — WET PREP, GENITAL
Clue Cells Wet Prep HPF POC: NONE SEEN
TRICH WET PREP: NONE SEEN
YEAST WET PREP: NONE SEEN

## 2014-02-04 LAB — POCT PREGNANCY, URINE: PREG TEST UR: NEGATIVE

## 2014-02-04 LAB — URINALYSIS, ROUTINE W REFLEX MICROSCOPIC
Bilirubin Urine: NEGATIVE
Glucose, UA: NEGATIVE mg/dL
Hgb urine dipstick: NEGATIVE
Ketones, ur: NEGATIVE mg/dL
Leukocytes, UA: NEGATIVE
NITRITE: NEGATIVE
PH: 5.5 (ref 5.0–8.0)
Protein, ur: NEGATIVE mg/dL
Urobilinogen, UA: 0.2 mg/dL (ref 0.0–1.0)

## 2014-02-04 MED ORDER — FLUCONAZOLE 150 MG PO TABS
150.0000 mg | ORAL_TABLET | ORAL | Status: DC
Start: 2014-02-04 — End: 2014-04-17

## 2014-02-04 MED ORDER — NYSTATIN-TRIAMCINOLONE 100000-0.1 UNIT/GM-% EX CREA: TOPICAL_CREAM | CUTANEOUS | Status: DC

## 2014-02-04 NOTE — MAU Note (Signed)
Pt presents to MAU with complaints of vaginal irritation with discharge for a month. Denies any change in partners.

## 2014-02-04 NOTE — Discharge Instructions (Signed)
Do not use Boric acid until external tissue is better.  [facility].  Get your prescriptions at the pharmacy and take as directed.

## 2014-02-04 NOTE — MAU Provider Note (Signed)
History     CSN: [id number]  Arrival date and time: 02/04/14 0907  Seen by provider at 1020    Chief Complaint  Patient presents with  . Vaginal Discharge   HPI Olivia Lawson 29 y.o.  Comes to MAU with vaginal irritation.  Reports history of yeast infection and repeated BV infections.  Today has irritation on perineum. Use boric acid suppository 2 days ago.  Is unhappy with the vaginal discharge.  States she knows her body and knows this is not normal vaginal discharge.  Last intercourse was 2 days ago.  Lost triamcinalone cream that she was prescribed previously.  Reviewed the notes from the last 2 visits.  Information in those notes consistent with client's verbal history.  She is very worried about the vaginal discharge.  OB History    Gravida Para Term Preterm AB TAB SAB Ectopic Multiple Living   4 3 3  0 1 0 1 0 0 3      Past Medical History  Diagnosis Date  . Placental abruption   . Migraine     Past Surgical History  Procedure Laterality Date  . Cesarean section    . Hernia repair    . Dental surgery      History reviewed. No pertinent family history.  History  Substance Use Topics  . Smoking status: Never Smoker   . Smokeless tobacco: Not on file  . Alcohol Use: No    Allergies:  Allergies  Allergen Reactions  . Monistat [Miconazole] Swelling and Rash    Ampule, 1 day insert.     Prescriptions prior to admission  Medication Sig Dispense Refill Last Dose  . Cholecalciferol (VITAMIN D3) 2000 UNITS TABS Take by mouth.   12/20/2013 at Unknown time  . Multiple Vitamin (MULTIVITAMIN WITH MINERALS) TABS tablet Take 1 tablet by mouth daily.   12/20/2013 at Unknown time  . Probiotic Product (PROBIOTIC DAILY PO) Take by mouth.   12/19/2013 at Unknown time    Review of Systems  Constitutional: Negative for fever.  Gastrointestinal: Negative for nausea, vomiting, abdominal pain, diarrhea and constipation.  Genitourinary:       Vaginal discharge. No vaginal  bleeding. No dysuria. No vaginal itching.   Physical Exam   Blood pressure 119/68, pulse 52, temperature 98.2 F (36.8 C), resp. rate 18, height 5\' 6"  (1.676 m), weight 179 lb (81.194 kg), last menstrual period 12/31/2013.  Physical Exam  Nursing note and vitals reviewed. Constitutional: She is oriented to person, place, and time. She appears well-developed and well-nourished.  HENT:  Head: Normocephalic.  Eyes: EOM are normal.  Neck: Neck supple.  GI: Soft. There is no tenderness.  Genitourinary:  Speculum exam: Vulva - hypopigmented perineum, white appearance, no broken skin seen Vagina - Small amount of yellow, creamy discharge, no odor, erythema of vaginal walls noted. Cervix - No contact bleeding Bimanual exam: Cervix closed Uterus non tender, normal size Adnexa non tender, no masses bilaterally GC/Chlam, wet prep done Chaperone present for exam.  Musculoskeletal: Normal range of motion.  Neurological: She is alert and oriented to person, place, and time.  Skin: Skin is warm and dry.  Psychiatric: She has a normal mood and affect.    MAU Course  Procedures Results for orders placed or performed during the hospital encounter of 02/04/14 (from the past 24 hour(s))  Urinalysis, Routine w reflex microscopic     Status: Abnormal   Collection Time: 02/04/14  9:20 AM  Result Value Ref Range   Color,  Urine YELLOW YELLOW   APPearance CLEAR CLEAR   Specific Gravity, Urine >1.030 (H) 1.005 - 1.030   pH 5.5 5.0 - 8.0   Glucose, UA NEGATIVE NEGATIVE mg/dL   Hgb urine dipstick NEGATIVE NEGATIVE   Bilirubin Urine NEGATIVE NEGATIVE   Ketones, ur NEGATIVE NEGATIVE mg/dL   Protein, ur NEGATIVE NEGATIVE mg/dL   Urobilinogen, UA 0.2 0.0 - 1.0 mg/dL   Nitrite NEGATIVE NEGATIVE   Leukocytes, UA NEGATIVE NEGATIVE  Pregnancy, urine POC     Status: None   Collection Time: 02/04/14  9:27 AM  Result Value Ref Range   Preg Test, Ur NEGATIVE NEGATIVE  Wet prep, genital     Status:  Abnormal   Collection Time: 02/04/14 10:18 AM  Result Value Ref Range   Yeast Wet Prep HPF POC NONE SEEN NONE SEEN   Trich, Wet Prep NONE SEEN NONE SEEN   Clue Cells Wet Prep HPF POC NONE SEEN NONE SEEN   WBC, Wet Prep HPF POC FEW (A) NONE SEEN    MDM This is the 3rd ER visit for vaginal problems.  Client wants long term treatment.  Advised she needs to establish with a provider - she can been seen at the [facility] and possibly have access to lower cost medication.  Assessment and Plan  Clinical yeast infection  Plan Will prescribe triamcinalone cream to use exeternally with Nystatin that she has. Will prescribe 2 doses of Diflucan. Do not use Boric acid until external tissue is better. Be seen at the [facility] for further problems.  Olivia Lawson 02/04/2014, 10:42 AM

## 2014-02-05 LAB — GC/CHLAMYDIA PROBE AMP (~~LOC~~) NOT AT ARMC
Chlamydia: NEGATIVE
NEISSERIA GONORRHEA: NEGATIVE

## 2014-04-16 ENCOUNTER — Inpatient Hospital Stay (HOSPITAL_COMMUNITY)
Admission: AD | Admit: 2014-04-16 | Discharge: 2014-04-17 | Disposition: A | Discharge status: Home / Self Care | Payer: Self-pay | Source: Ambulatory Visit | Attending: Obstetrics & Gynecology | Admitting: Obstetrics & Gynecology

## 2014-04-16 ENCOUNTER — Encounter (HOSPITAL_COMMUNITY): Payer: Self-pay | Admitting: *Deleted

## 2014-04-16 DIAGNOSIS — N760 Acute vaginitis: Secondary | ICD-10-CM | POA: Insufficient documentation

## 2014-04-16 DIAGNOSIS — B9689 Other specified bacterial agents as the cause of diseases classified elsewhere: Secondary | ICD-10-CM | POA: Insufficient documentation

## 2014-04-16 DIAGNOSIS — A499 Bacterial infection, unspecified: Secondary | ICD-10-CM

## 2014-04-16 DIAGNOSIS — R42 Dizziness and giddiness: Secondary | ICD-10-CM | POA: Insufficient documentation

## 2014-04-16 LAB — CBC WITH DIFFERENTIAL/PLATELET
Basophils Absolute: 0 10*3/uL (ref 0.0–0.1)
Basophils Relative: 1 % (ref 0–1)
EOS ABS: 0.2 10*3/uL (ref 0.0–0.7)
Eosinophils Relative: 4 % (ref 0–5)
HCT: 38.8 % (ref 36.0–46.0)
Hemoglobin: 13.3 g/dL (ref 12.0–15.0)
LYMPHS PCT: 45 % (ref 12–46)
Lymphs Abs: 2.5 10*3/uL (ref 0.7–4.0)
MCH: 30 pg (ref 26.0–34.0)
MCHC: 34.3 g/dL (ref 30.0–36.0)
MCV: 87.6 fL (ref 78.0–100.0)
Monocytes Absolute: 0.6 10*3/uL (ref 0.1–1.0)
Monocytes Relative: 11 % (ref 3–12)
NEUTROS PCT: 39 % — AB (ref 43–77)
Neutro Abs: 2.2 10*3/uL (ref 1.7–7.7)
PLATELETS: 282 10*3/uL (ref 150–400)
RBC: 4.43 MIL/uL (ref 3.87–5.11)
RDW: 12.7 % (ref 11.5–15.5)
WBC: 5.6 10*3/uL (ref 4.0–10.5)

## 2014-04-16 LAB — URINALYSIS, ROUTINE W REFLEX MICROSCOPIC
Bilirubin Urine: NEGATIVE
GLUCOSE, UA: NEGATIVE mg/dL
Hgb urine dipstick: NEGATIVE
Ketones, ur: 15 mg/dL — AB
Leukocytes, UA: NEGATIVE
NITRITE: NEGATIVE
PH: 5 (ref 5.0–8.0)
Protein, ur: NEGATIVE mg/dL
SPECIFIC GRAVITY, URINE: 1.02 (ref 1.005–1.030)
Urobilinogen, UA: 0.2 mg/dL (ref 0.0–1.0)

## 2014-04-16 LAB — POCT PREGNANCY, URINE: PREG TEST UR: NEGATIVE

## 2014-04-16 LAB — WET PREP, GENITAL
Trich, Wet Prep: NONE SEEN
YEAST WET PREP: NONE SEEN

## 2014-04-16 MED ORDER — CLINDAMYCIN HCL 150 MG PO CAPS: 150.0000 mg | ORAL_CAPSULE | Freq: Four times a day (QID) | ORAL | Status: DC

## 2014-04-16 NOTE — MAU Provider Note (Signed)
CSN: [id number]     Arrival date & time 04/16/14  2200 History   None    No chief complaint on file.    (Consider location/radiation/quality/duration/timing/severity/associated sxs/prior Treatment) Patient is a 29 y.o. female presenting with vaginal discharge. The history is provided by the patient.  Vaginal Discharge This is a new problem. The current episode started more than 1 month ago. The problem has been gradually worsening. Pertinent negatives include no abdominal pain, anorexia, chest pain, chills, congestion, coughing, diaphoresis, fever, nausea, rash, sore throat, swollen glands, visual change, vomiting or weakness. Headaches: occasional.   Olivia Lawson is a 29 y.o. [phone] [id number] who is not pregnant and presents to the MAU with vaginal d/c that has been ongoing for the past month. She has used OTC 7 day yeast cream with minimal results. She is concerned that she may have BV due to the odor. Last pap smear without 2 years and was normal. No hx of STI's.   Patient also complains of dizziness that started last year. She has been evaluated here in the past and encouraged to follow up with a PCP for her health concerns. She has not followed up.   She also states that she had a hernia repair to the left inguinal area and now thinks she has a hernia on the right.   Past Medical History  Diagnosis Date  . Placental abruption   . Migraine    Past Surgical History  Procedure Laterality Date  . Cesarean section    . Hernia repair    . Dental surgery     History reviewed. No pertinent family history. History  Substance Use Topics  . Smoking status: Never Smoker   . Smokeless tobacco: Not on file  . Alcohol Use: No   OB History    Gravida Para Term Preterm AB TAB SAB Ectopic Multiple Living   4 3 3  0 1 0 1 0 0 3     Review of Systems  Constitutional: Negative for fever, chills and diaphoresis.  HENT: Negative for congestion and sore throat.   Respiratory: Negative for cough.    Cardiovascular: Negative for chest pain.  Gastrointestinal: Negative for nausea, vomiting, abdominal pain and anorexia.  Genitourinary: Positive for vaginal discharge.  Skin: Negative for rash.  Neurological: Positive for dizziness. Negative for weakness. Headaches: occasional.  Psychiatric/Behavioral: Negative for confusion and sleep disturbance.      Allergies  Monistat  Home Medications   Prior to Admission medications   Medication Sig Start Date End Date Taking? Authorizing Provider  Cholecalciferol (VITAMIN D3) 2000 UNITS TABS Take 2,000 Units by mouth daily.     Historical Provider, MD  clindamycin (CLEOCIN) 150 MG capsule Take 1 capsule (150 mg total) by mouth every 6 (six) hours. 04/16/14   Sy Saintjean Orlene OchM Katheren Jimmerson, NP  fluconazole (DIFLUCAN) 200 MG tablet Take 1 tablet (200 mg total) by mouth daily. 04/17/14 04/24/14  Trentin Knappenberger Orlene OchM Skii Cleland, NP  Multiple Vitamin (MULTIVITAMIN WITH MINERALS) TABS tablet Take 3 tablets by mouth daily.     Historical Provider, MD  nystatin-triamcinolone (MYCOLOG II) cream Apply to affected area twice a day 02/04/14   Currie Pariserri L Burleson, NP   BP 123/85 mmHg  Pulse 49  Temp(Src) 98.2 F (36.8 C) (Oral)  Resp 16  Ht 5' 6.5" (1.689 m)  Wt 177 lb (80.287 kg)  BMI 28.14 kg/m2  SpO2 100%  LMP 03/20/2014 Physical Exam  Constitutional: She is oriented to person, place, and time. She appears well-developed and well-nourished.  No distress.  HENT:  Head: Normocephalic.  Eyes: Conjunctivae and EOM are normal.  Neck: Normal range of motion. Neck supple.  Cardiovascular: Normal rate.   Pulmonary/Chest: Effort normal.  Abdominal: Soft. There is no tenderness.  Genitourinary:  External genitalia without lesions. Frothy d/c vaginal vault. No CMT, no adnexal tenderness, uterus without palpable enlargement.   Musculoskeletal: Normal range of motion.  Neurological: She is alert and oriented to person, place, and time. She has normal strength. No cranial nerve deficit. Gait  normal.  Skin: Skin is warm and dry.  Psychiatric: She has a normal mood and affect. Her behavior is normal.  Nursing note and vitals reviewed.   ED Course  Procedures (including critical care time) Labs Review Results for orders placed or performed during the hospital encounter of 04/16/14 (from the past 48 hour(s))  Urinalysis, Routine w reflex microscopic     Status: Abnormal   Collection Time: 04/16/14 10:21 PM  Result Value Ref Range   Color, Urine YELLOW YELLOW   APPearance CLEAR CLEAR   Specific Gravity, Urine 1.020 1.005 - 1.030   pH 5.0 5.0 - 8.0   Glucose, UA NEGATIVE NEGATIVE mg/dL   Hgb urine dipstick NEGATIVE NEGATIVE   Bilirubin Urine NEGATIVE NEGATIVE   Ketones, ur 15 (A) NEGATIVE mg/dL   Protein, ur NEGATIVE NEGATIVE mg/dL   Urobilinogen, UA 0.2 0.0 - 1.0 mg/dL   Nitrite NEGATIVE NEGATIVE   Leukocytes, UA NEGATIVE NEGATIVE    Comment: MICROSCOPIC NOT DONE ON URINES WITH NEGATIVE PROTEIN, BLOOD, LEUKOCYTES, NITRITE, OR GLUCOSE <1000 mg/dL.  Pregnancy, urine POC     Status: None   Collection Time: 04/16/14 10:33 PM  Result Value Ref Range   Preg Test, Ur NEGATIVE NEGATIVE    Comment:        THE SENSITIVITY OF THIS METHODOLOGY IS >24 mIU/mL   HIV antibody     Status: None   Collection Time: 04/16/14 10:50 PM  Result Value Ref Range   HIV Screen 4th Generation wRfx Non Reactive Non Reactive    Comment: (NOTE) Performed At: [facility] [address] [place], [place] [id number] Mila Homer MD WU:[id number]   RPR     Status: None   Collection Time: 04/16/14 10:50 PM  Result Value Ref Range   RPR Ser Ql Non Reactive Non Reactive    Comment: (NOTE) Performed At: [facility] [address] [place], [place] [id number] Mila Homer MD VH:[id number]   CBC with Differential/Platelet     Status: Abnormal   Collection Time: 04/16/14 10:50 PM  Result Value Ref Range   WBC 5.6 4.0 - 10.5 K/uL   RBC 4.43 3.87 - 5.11 MIL/uL   Hemoglobin  13.3 12.0 - 15.0 g/dL   HCT 41.3 24.4 - 01.0 %   MCV 87.6 78.0 - 100.0 fL   MCH 30.0 26.0 - 34.0 pg   MCHC 34.3 30.0 - 36.0 g/dL   RDW 27.2 53.6 - 64.4 %   Platelets 282 150 - 400 K/uL   Neutrophils Relative % 39 (L) 43 - 77 %   Neutro Abs 2.2 1.7 - 7.7 K/uL   Lymphocytes Relative 45 12 - 46 %   Lymphs Abs 2.5 0.7 - 4.0 K/uL   Monocytes Relative 11 3 - 12 %   Monocytes Absolute 0.6 0.1 - 1.0 K/uL   Eosinophils Relative 4 0 - 5 %   Eosinophils Absolute 0.2 0.0 - 0.7 K/uL   Basophils Relative 1 0 - 1 %  Basophils Absolute 0.0 0.0 - 0.1 K/uL  Wet prep, genital     Status: Abnormal   Collection Time: 04/16/14 11:00 PM  Result Value Ref Range   Yeast Wet Prep HPF POC NONE SEEN NONE SEEN   Trich, Wet Prep NONE SEEN NONE SEEN   Clue Cells Wet Prep HPF POC MODERATE (A) NONE SEEN   WBC, Wet Prep HPF POC FEW (A) NONE SEEN    Comment: MANY BACTERIA SEEN     MDM  29 y.o. [id number] who is not currently pregnant with vaginal d/c with odor and chronic dizziness that has been off and on for the past year. Stable for discharge without focal neuro deficits. Encouraged patient as on previous visits to follow up with PCP. Information for [facility] given. Will treat for BV and patient to follow up for pap smear and routine medical care.   Final diagnoses:  Bacterial vaginosis  Dizzy spells

## 2014-04-16 NOTE — Discharge Instructions (Signed)
We are treating your bacterial vaginosis. Take the medication as directed. It is important that you follow up for your dizziness and your other health care with a primary care doctor. Call the number for [facility] to schedule follow up.   You may try using Bonnie (meclezine) 25 mg for your dizziness. [facility] a [facility] but you will need to ask for it because it is behind the counter in the pharmacy.

## 2014-04-16 NOTE — MAU Note (Signed)
Pt reports she has had a vaginal discharge with odor. States she recently used monostat for yeast symptoms.

## 2014-04-17 LAB — RPR: RPR: NONREACTIVE

## 2014-04-17 LAB — HIV ANTIBODY (ROUTINE TESTING W REFLEX): HIV Screen 4th Generation wRfx: NONREACTIVE

## 2014-04-17 LAB — GC/CHLAMYDIA PROBE AMP (~~LOC~~) NOT AT ARMC
CHLAMYDIA, DNA PROBE: NEGATIVE
Neisseria Gonorrhea: NEGATIVE

## 2014-04-17 MED ORDER — FLUCONAZOLE 200 MG PO TABS: 200.0000 mg | ORAL_TABLET | Freq: Every day | ORAL | Status: AC

## 2014-05-14 ENCOUNTER — Encounter (HOSPITAL_COMMUNITY): Payer: Self-pay

## 2014-05-14 ENCOUNTER — Inpatient Hospital Stay (HOSPITAL_COMMUNITY)
Admission: AD | Admit: 2014-05-14 | Discharge: 2014-05-14 | Disposition: A | Discharge status: Home / Self Care | Payer: Self-pay | Source: Ambulatory Visit | Attending: Obstetrics & Gynecology | Admitting: Obstetrics & Gynecology

## 2014-05-14 DIAGNOSIS — Z3202 Encounter for pregnancy test, result negative: Secondary | ICD-10-CM | POA: Insufficient documentation

## 2014-05-14 DIAGNOSIS — N898 Other specified noninflammatory disorders of vagina: Secondary | ICD-10-CM | POA: Insufficient documentation

## 2014-05-14 LAB — URINALYSIS, ROUTINE W REFLEX MICROSCOPIC
Bilirubin Urine: NEGATIVE
Glucose, UA: NEGATIVE mg/dL
HGB URINE DIPSTICK: NEGATIVE
KETONES UR: NEGATIVE mg/dL
LEUKOCYTES UA: NEGATIVE
NITRITE: NEGATIVE
PROTEIN: NEGATIVE mg/dL
Specific Gravity, Urine: 1.025 (ref 1.005–1.030)
Urobilinogen, UA: 0.2 mg/dL (ref 0.0–1.0)
pH: 6 (ref 5.0–8.0)

## 2014-05-14 LAB — WET PREP, GENITAL
Clue Cells Wet Prep HPF POC: NONE SEEN
Trich, Wet Prep: NONE SEEN
Yeast Wet Prep HPF POC: NONE SEEN

## 2014-05-14 LAB — POCT PREGNANCY, URINE: Preg Test, Ur: NEGATIVE

## 2014-05-14 NOTE — MAU Note (Signed)
Has a white d/c.....had been on antibiotics for BV. Lost part of prescription

## 2014-05-14 NOTE — MAU Provider Note (Signed)
History     CSN: [id number]  Arrival date and time: 05/14/14 0865   First Provider Initiated Contact with Patient 05/14/14 [id number]      Chief Complaint  Patient presents with  . Vaginal Discharge   HPI   Ms. Olivia Lawson is a 29 y.o. female [phone] who presents with vaginal discharge. The discharge is brown, thick, with slight odor. She first noticed the odor 3 days ago. She was diagnosed with BV a month ago and lost part of her prescription medication; she did not finish the medication. She gets BV frequently and feels that the flagyl helps clear it up. She denies vaginal bleeding.   OB History    Gravida Para Term Preterm AB TAB SAB Ectopic Multiple Living   0 1 0 1 0 0 3      Past Medical History  Diagnosis Date  . Placental abruption   . Migraine     Past Surgical History  Procedure Laterality Date  . Cesarean section    . Hernia repair    . Dental surgery      History reviewed. No pertinent family history.  History  Substance Use Topics  . Smoking status: Never Smoker   . Smokeless tobacco: Not on file  . Alcohol Use: No    Allergies:  Allergies  Allergen Reactions  . Monistat [Miconazole] Swelling and Rash    Ampule, 1 day insert.     Prescriptions prior to admission  Medication Sig Dispense Refill Last Dose  . Cholecalciferol (VITAMIN D3) 2000 UNITS TABS Take 2,000 Units by mouth daily.    02/03/2014 at Unknown time  . clindamycin (CLEOCIN) 150 MG capsule Take 1 capsule (150 mg total) by mouth every 6 (six) hours. 28 capsule 0   . Multiple Vitamin (MULTIVITAMIN WITH MINERALS) TABS tablet Take 3 tablets by mouth daily.    02/03/2014 at Unknown time  . nystatin-triamcinolone (MYCOLOG II) cream Apply to affected area twice a day 15 g 1    Results for orders placed or performed during the hospital encounter of 05/14/14 (from the past 48 hour(s))  Urinalysis, Routine w reflex microscopic     Status: None   Collection Time: 05/14/14  8:50 AM  Result  Value Ref Range   Color, Urine YELLOW YELLOW   APPearance CLEAR CLEAR   Specific Gravity, Urine 1.025 1.005 - 1.030   pH 6.0 5.0 - 8.0   Glucose, UA NEGATIVE NEGATIVE mg/dL   Hgb urine dipstick NEGATIVE NEGATIVE   Bilirubin Urine NEGATIVE NEGATIVE   Ketones, ur NEGATIVE NEGATIVE mg/dL   Protein, ur NEGATIVE NEGATIVE mg/dL   Urobilinogen, UA 0.2 0.0 - 1.0 mg/dL   Nitrite NEGATIVE NEGATIVE   Leukocytes, UA NEGATIVE NEGATIVE    Comment: MICROSCOPIC NOT DONE ON URINES WITH NEGATIVE PROTEIN, BLOOD, LEUKOCYTES, NITRITE, OR GLUCOSE <1000 mg/dL.  Pregnancy, urine POC     Status: None   Collection Time: 05/14/14  9:02 AM  Result Value Ref Range   Preg Test, Ur NEGATIVE NEGATIVE    Comment:        THE SENSITIVITY OF THIS METHODOLOGY IS >24 mIU/mL     Review of Systems  Constitutional: Negative for fever and chills.  Gastrointestinal: Negative for abdominal pain.   Physical Exam   Blood pressure 120/56, pulse 57, temperature 98 F (36.7 C), temperature source Oral, resp. rate 18, last menstrual period 05/08/2014.  Physical Exam  Constitutional: She is oriented to person, place, and time. She appears  well-developed and well-nourished. No distress.  HENT:  Head: Normocephalic.  Eyes: Pupils are equal, round, and reactive to light.  Respiratory: Effort normal.  Genitourinary:  GC and Wet prep collected without speculum.   Musculoskeletal: Normal range of motion.  Neurological: She is alert and oriented to person, place, and time.  Skin: Skin is warm. She is not diaphoretic.  Psychiatric: Her behavior is normal.    MAU Course  Procedures  None  MDM   Assessment and Plan   A:  1. Vaginal discharge     P:  Discharge home in stable condition No prescriptions given at this time Return to MAU for emergencies only    Duane LopeJennifer I Rasch, NP 05/14/2014 9:32 AM

## 2014-05-15 LAB — GC/CHLAMYDIA PROBE AMP (~~LOC~~) NOT AT ARMC
Chlamydia: NEGATIVE
NEISSERIA GONORRHEA: NEGATIVE

## 2014-05-15 LAB — HIV ANTIBODY (ROUTINE TESTING W REFLEX): HIV SCREEN 4TH GENERATION: NONREACTIVE

## 2014-05-23 ENCOUNTER — Inpatient Hospital Stay (HOSPITAL_COMMUNITY)
Admission: AD | Admit: 2014-05-23 | Discharge: 2014-05-24 | Disposition: A | Discharge status: Home / Self Care | Payer: Self-pay | Source: Ambulatory Visit | Attending: Obstetrics and Gynecology | Admitting: Obstetrics and Gynecology

## 2014-05-23 DIAGNOSIS — N760 Acute vaginitis: Secondary | ICD-10-CM | POA: Insufficient documentation

## 2014-05-23 DIAGNOSIS — B9689 Other specified bacterial agents as the cause of diseases classified elsewhere: Secondary | ICD-10-CM | POA: Insufficient documentation

## 2014-05-23 LAB — POCT PREGNANCY, URINE: Preg Test, Ur: NEGATIVE

## 2014-05-23 NOTE — MAU Note (Signed)
Pt reports a white vaginal discharge and a odor. Denies pain.

## 2014-05-23 NOTE — MAU Provider Note (Signed)
History     CSN: [id number]  Arrival date and time: 05/23/14 2131   First Provider Initiated Contact with Patient 05/23/14 2303      No chief complaint on file.  HPI Comments: Olivia Lawson is a 29 y.o. [phone] who presents today with vaginal discharge with odor. She states that she has had recurrent BV infections.   Vaginal Discharge The patient's primary symptoms include vaginal discharge. This is a new problem. The current episode started in the past 7 days. The problem occurs constantly. The problem has been unchanged. The patient is experiencing no pain. She is not pregnant. Pertinent negatives include no abdominal pain, constipation, diarrhea, dysuria, fever, frequency, nausea, urgency or vomiting. The vaginal discharge was white and malodorous. There has been no bleeding. Nothing aggravates the symptoms. She has tried nothing for the symptoms. She is sexually active. No, her partner does not have an STD. She uses nothing for contraception.    Past Medical History  Diagnosis Date  . Placental abruption   . Migraine     Past Surgical History  Procedure Laterality Date  . Cesarean section    . Hernia repair    . Dental surgery      No family history on file.  History  Substance Use Topics  . Smoking status: Never Smoker   . Smokeless tobacco: Not on file  . Alcohol Use: No    Allergies:  Allergies  Allergen Reactions  . Monistat [Miconazole] Swelling and Rash    Ampule, 1 day insert.     Prescriptions prior to admission  Medication Sig Dispense Refill Last Dose  . Cholecalciferol (VITAMIN D3) 2000 UNITS TABS Take 2,000 Units by mouth daily.    Past Week at Unknown time  . Lactobacillus (REPHRESH PRO-B) CAPS Take 1 capsule by mouth daily.   Past Week at Unknown time  . OVER THE COUNTER MEDICATION Take 1 capsule by mouth 2 (two) times daily. Herbalife, snack defense   Past Week at Unknown time  . OVER THE COUNTER MEDICATION Take 1 capsule by mouth 3 (three) times  daily. Herbalifeline,   Past Week at Unknown time  . OVER THE COUNTER MEDICATION Take 1 tablet by mouth 3 (three) times daily. Herbalife, Xrat-Cal   Past Week at Unknown time  . OVER THE COUNTER MEDICATION Take 1 tablet by mouth 3 (three) times daily. Multivitamin Complex   Past Week at Unknown time  . OVER THE COUNTER MEDICATION Take 1 capsule by mouth 2 (two) times daily. Herbalife, Cell Activator   Past Week at Unknown time    Review of Systems  Constitutional: Negative for fever.  Gastrointestinal: Negative for nausea, vomiting, abdominal pain, diarrhea and constipation.  Genitourinary: Positive for vaginal discharge. Negative for dysuria, urgency and frequency.   Physical Exam   Blood pressure 115/71, pulse 97, temperature 98.1 F (36.7 C), temperature source Oral, resp. rate 16, height 5' 6.5" (1.689 m), weight 78.472 kg (173 lb), last menstrual period 05/08/2014, SpO2 99 %.  Physical Exam  Nursing note and vitals reviewed. Constitutional: She is oriented to person, place, and time. She appears well-developed and well-nourished. No distress.  Cardiovascular: Normal rate.   Respiratory: Effort normal.  GI: Soft. There is no tenderness. There is no rebound.  Genitourinary:   External: no lesion Vagina: small amount of white discharge. Slight odor  Cervix: pink, smooth, no CMT Uterus: NSSC Adnexa: NT   Neurological: She is alert and oriented to person, place, and time.  Skin: Skin  is warm and dry.  Psychiatric: She has a normal mood and affect.   Results for orders placed or performed during the hospital encounter of 05/23/14 (from the past 24 hour(s))  Pregnancy, urine POC     Status: None   Collection Time: 05/23/14 10:00 PM  Result Value Ref Range   Preg Test, Ur NEGATIVE NEGATIVE  Wet prep, genital     Status: Abnormal   Collection Time: 05/23/14 11:05 PM  Result Value Ref Range   Yeast Wet Prep HPF POC NONE SEEN NONE SEEN   Trich, Wet Prep NONE SEEN NONE SEEN    Clue Cells Wet Prep HPF POC NONE SEEN NONE SEEN   WBC, Wet Prep HPF POC FEW (A) NONE SEEN   MAU Course  Procedures  MDM  Assessment and Plan   1. Bacterial vaginal infection    DC home RX clinda 2% cream Return to MAU as needed  Follow-up Information    [facility] [place]   Why:  As needed   Contact information:   [address] [place] [id number] [phone]       Tawnya CrookHogan, Alanni Vader Donovan 05/23/2014, 11:04 PM

## 2014-05-24 DIAGNOSIS — N760 Acute vaginitis: Secondary | ICD-10-CM

## 2014-05-24 DIAGNOSIS — A499 Bacterial infection, unspecified: Secondary | ICD-10-CM

## 2014-05-24 LAB — WET PREP, GENITAL
CLUE CELLS WET PREP: NONE SEEN
TRICH WET PREP: NONE SEEN
YEAST WET PREP: NONE SEEN

## 2014-05-24 LAB — URINALYSIS, ROUTINE W REFLEX MICROSCOPIC
BILIRUBIN URINE: NEGATIVE
GLUCOSE, UA: NEGATIVE mg/dL
HGB URINE DIPSTICK: NEGATIVE
Ketones, ur: NEGATIVE mg/dL
Leukocytes, UA: NEGATIVE
Nitrite: NEGATIVE
PH: 5 (ref 5.0–8.0)
PROTEIN: NEGATIVE mg/dL
Specific Gravity, Urine: 1.02 (ref 1.005–1.030)
Urobilinogen, UA: 0.2 mg/dL (ref 0.0–1.0)

## 2014-05-24 LAB — GC/CHLAMYDIA PROBE AMP (~~LOC~~) NOT AT ARMC
Chlamydia: NEGATIVE
Neisseria Gonorrhea: NEGATIVE

## 2014-05-24 MED ORDER — CLINDAMYCIN PHOSPHATE 2 % VA CREA
1.0000 | TOPICAL_CREAM | Freq: Every day | VAGINAL | Status: DC
Start: 2014-05-24 — End: 2014-12-23

## 2014-05-24 NOTE — Discharge Instructions (Signed)
Bacterial Vaginosis Bacterial vaginosis is a vaginal infection that occurs when the normal balance of bacteria in the vagina is disrupted. It results from an overgrowth of certain bacteria. This is the most common vaginal infection in women of childbearing age. Treatment is important to prevent complications, especially in pregnant women, as it can cause a premature delivery. CAUSES  Bacterial vaginosis is caused by an increase in harmful bacteria that are normally present in smaller amounts in the vagina. Several different kinds of bacteria can cause bacterial vaginosis. However, the reason that the condition develops is not fully understood. RISK FACTORS Certain activities or behaviors can put you at an increased risk of developing bacterial vaginosis, including:  Having a new sex partner or multiple sex partners.  Douching.  Using an intrauterine device (IUD) for contraception. Women do not get bacterial vaginosis from toilet seats, bedding, swimming pools, or contact with objects around them. SIGNS AND SYMPTOMS  Some women with bacterial vaginosis have no signs or symptoms. Common symptoms include:  Grey vaginal discharge.  A fishlike odor with discharge, especially after sexual intercourse.  Itching or burning of the vagina and vulva.  Burning or pain with urination. DIAGNOSIS  Your health care provider will take a medical history and examine the vagina for signs of bacterial vaginosis. A sample of vaginal fluid may be taken. Your health care provider will look at this sample under a microscope to check for bacteria and abnormal cells. A vaginal pH test may also be done.  TREATMENT  Bacterial vaginosis may be treated with antibiotic medicines. These may be given in the form of a pill or a vaginal cream. A second round of antibiotics may be prescribed if the condition comes back after treatment.  HOME CARE INSTRUCTIONS   Only take over-the-counter or prescription medicines as  directed by your health care provider.  If antibiotic medicine was prescribed, take it as directed. Make sure you finish it even if you start to feel better.  Do not have sex until treatment is completed.  Tell all sexual partners that you have a vaginal infection. They should see their health care provider and be treated if they have problems, such as a mild rash or itching.  Practice safe sex by using condoms and only having one sex partner. SEEK MEDICAL CARE IF:   Your symptoms are not improving after 3 days of treatment.  You have increased discharge or pain.  You have a fever. MAKE SURE YOU:   Understand these instructions.  Will watch your condition.  Will get help right away if you are not doing well or get worse. FOR MORE INFORMATION  Centers for Disease Control and Prevention, Division of STD Prevention: www.cdc.gov/std American Sexual Health Association (ASHA): www.ashastd.org  Document Released: 01/05/2005 Document Revised: 10/26/2012 Document Reviewed: 08/17/2012 ExitCare Patient Information 2015 [employer]. This information is not intended to replace advice given to you by your health care provider. Make sure you discuss any questions you have with your health care provider.  

## 2014-05-25 ENCOUNTER — Telehealth: Payer: Self-pay | Admitting: Advanced Practice Midwife

## 2014-05-25 ENCOUNTER — Telehealth: Payer: Self-pay | Admitting: Certified Nurse Midwife

## 2014-05-25 MED ORDER — FLUCONAZOLE 150 MG PO TABS: ORAL_TABLET | ORAL | Status: DC

## 2014-05-25 MED ORDER — METRONIDAZOLE 500 MG PO TABS: 500.0000 mg | ORAL_TABLET | Freq: Two times a day (BID) | ORAL | Status: DC

## 2014-05-25 NOTE — Telephone Encounter (Signed)
Pt called to report difficulty obtaining medications from pharmacy. Flagyl 500 mg BID x 7 days and Diflucan 150 mg x 2 doses phoned in to pt pharmacy. Pt tolerates both these medications despite her allergy to miconazole.

## 2014-09-17 NOTE — Telephone Encounter (Signed)
Telephone call  

## 2014-12-23 ENCOUNTER — Encounter (HOSPITAL_COMMUNITY): Payer: Self-pay | Admitting: *Deleted

## 2014-12-23 ENCOUNTER — Inpatient Hospital Stay (HOSPITAL_COMMUNITY)
Admission: AD | Admit: 2014-12-23 | Discharge: 2014-12-23 | Disposition: A | Discharge status: Home / Self Care | Payer: Self-pay | Source: Ambulatory Visit | Attending: Obstetrics & Gynecology | Admitting: Obstetrics & Gynecology

## 2014-12-23 DIAGNOSIS — N898 Other specified noninflammatory disorders of vagina: Secondary | ICD-10-CM | POA: Insufficient documentation

## 2014-12-23 DIAGNOSIS — Z3202 Encounter for pregnancy test, result negative: Secondary | ICD-10-CM | POA: Insufficient documentation

## 2014-12-23 LAB — URINALYSIS, ROUTINE W REFLEX MICROSCOPIC
BILIRUBIN URINE: NEGATIVE
Glucose, UA: NEGATIVE mg/dL
HGB URINE DIPSTICK: NEGATIVE
KETONES UR: NEGATIVE mg/dL
Leukocytes, UA: NEGATIVE
Nitrite: NEGATIVE
Protein, ur: NEGATIVE mg/dL
Specific Gravity, Urine: 1.02 (ref 1.005–1.030)
pH: 6 (ref 5.0–8.0)

## 2014-12-23 LAB — WET PREP, GENITAL
Clue Cells Wet Prep HPF POC: NONE SEEN
Sperm: NONE SEEN
Trich, Wet Prep: NONE SEEN
Yeast Wet Prep HPF POC: NONE SEEN

## 2014-12-23 LAB — POCT PREGNANCY, URINE: PREG TEST UR: NEGATIVE

## 2014-12-23 NOTE — MAU Provider Note (Signed)
Chief Complaint:  Vaginal Discharge   First Provider Initiated Contact with Patient 12/23/14 1324      HPI  Olivia Lawson is a 29 y.o. [id number] who presents to maternity admissions reporting Vaginal discharge with occasional vaginal irritation. States is concerned about STDs "because of something he said".  Wants wet prep and G/C, but declines serum testing of HIV/HbSAG/RPR. She reports no vaginal bleeding, urinary symptoms, h/a, dizziness, n/v, or fever/chills.    RN Note: Has a d/c, for wks. Clear, to white/yellow;slight odor, some irritation         Past Medical History: Past Medical History  Diagnosis Date  . Placental abruption   . Migraine     Past obstetric history: OB History  Gravida Para Term Preterm AB SAB TAB Ectopic Multiple Living  4 3 3  0 1 1 0 0 0 3    # Outcome Date GA Lbr Len/2nd Weight Sex Delivery Anes PTL Lv  4 Term 08/28/10 [id number] 13:08 / 00:18 2.975 kg (6 lb 8.9 oz) M Vag-Spont None  Y  3 SAB           2 Term         Y  1 Term         Y      Past Surgical History: Past Surgical History  Procedure Laterality Date  . Cesarean section    . Hernia repair    . Dental surgery      Family History: History reviewed. No pertinent family history.  Social History: Social History  Substance Use Topics  . Smoking status: Never Smoker   . Smokeless tobacco: None  . Alcohol Use: No    Allergies:  Allergies  Allergen Reactions  . Monistat [Miconazole] Swelling and Rash    Ampule, 1 day insert.     Meds:  Prescriptions prior to admission  Medication Sig Dispense Refill Last Dose  . clindamycin (CLEOCIN) 2 % vaginal cream Place 1 Applicatorful vaginally at bedtime. For 7 nights (Patient not taking: Reported on 12/23/2014) 40 g 0   . fluconazole (DIFLUCAN) 150 MG tablet Take 1 tab now and 1 in 3 days (Patient not taking: Reported on 12/23/2014) 2 tablet 0   . metroNIDAZOLE (FLAGYL) 500 MG tablet Take 1 tablet (500 mg total) by mouth 2 (two) times  daily. (Patient not taking: Reported on 12/23/2014) 14 tablet 0    ROS:  Review of Systems  Constitutional: Negative for fever and chills.  Respiratory: Negative for shortness of breath.   Gastrointestinal: Negative for nausea, vomiting, abdominal pain, diarrhea and constipation.  Genitourinary: Positive for vaginal discharge. Negative for dysuria, vaginal bleeding and pelvic pain.  Musculoskeletal: Negative for back pain.   I have reviewed patient's Past Medical Hx, Surgical Hx, Family Hx, Social Hx, medications and allergies.   Physical Exam  Patient Vitals for the past 24 hrs:  BP Temp Temp src Pulse Resp Height Weight  12/23/14 1234 116/66 mmHg 98.7 F (37.1 C) Oral 63 16 5\' 6"  (1.676 m) 77.837 kg (171 lb 9.6 oz)   Constitutional: Well-developed, well-nourished female in no acute distress.  Cardiovascular: normal rate and rhythm Respiratory: normal effort, no distress. Lungs CTAB with no wheezes or crackles GI: Abd soft, non-tender.  Nondistended.  No rebound, No guarding.   MS: Extremities nontender, no edema, normal ROM Neurologic: Alert and oriented x 4.   Grossly nonfocal. GU: Neg CVAT. Skin:  Warm and Dry Psych:  Affect appropriate.  PELVIC EXAM: Cervix pink,  visually closed, without lesion, scant white creamy discharge, vaginal walls and external genitalia normal Bimanual exam: Cervix firm, anterior, neg CMT, uterus nontender, nonenlarged, adnexa without tenderness, enlargement, or mass  Labs: Results for orders placed or performed during the hospital encounter of 12/23/14 (from the past 24 hour(s))  Urinalysis, Routine w reflex microscopic (not at [facility])     Status: None   Collection Time: 12/23/14 12:41 PM  Result Value Ref Range   Color, Urine YELLOW YELLOW   APPearance CLEAR CLEAR   Specific Gravity, Urine 1.020 1.005 - 1.030   pH 6.0 5.0 - 8.0   Glucose, UA NEGATIVE NEGATIVE mg/dL   Hgb urine dipstick NEGATIVE NEGATIVE   Bilirubin Urine NEGATIVE NEGATIVE    Ketones, ur NEGATIVE NEGATIVE mg/dL   Protein, ur NEGATIVE NEGATIVE mg/dL   Nitrite NEGATIVE NEGATIVE   Leukocytes, UA NEGATIVE NEGATIVE  Pregnancy, urine POC     Status: None   Collection Time: 12/23/14 12:44 PM  Result Value Ref Range   Preg Test, Ur NEGATIVE NEGATIVE  Wet prep, genital     Status: Abnormal   Collection Time: 12/23/14  1:34 PM  Result Value Ref Range   Yeast Wet Prep HPF POC NONE SEEN NONE SEEN   Trich, Wet Prep NONE SEEN NONE SEEN   Clue Cells Wet Prep HPF POC NONE SEEN NONE SEEN   WBC, Wet Prep HPF POC FEW (A) NONE SEEN   Sperm NONE SEEN       Imaging:  No results found.  MAU Course/MDM: I have ordered labs as follows:  UA, GC/Chlamydia, wet prep Imaging ordered: none Results reviewed.    Treatments in MAU included none.   Pt stable at time of discharge.  Assessment: Vaginal discharge, normal physiologic  Plan: Discharge home Recommend regular annual gyn exams     Medication List    ASK your doctor about these medications        clindamycin 2 % vaginal cream  Commonly known as:  CLEOCIN  Place 1 Applicatorful vaginally at bedtime. For 7 nights     fluconazole 150 MG tablet  Commonly known as:  DIFLUCAN  Take 1 tab now and 1 in 3 days     metroNIDAZOLE 500 MG tablet  Commonly known as:  FLAGYL  Take 1 tablet (500 mg total) by mouth 2 (two) times daily.       Encouraged to return here or to other Urgent Care/ED if she develops worsening of symptoms, increase in pain, fever, or other concerning symptoms.   Wynelle Bourgeois CNM, MSN Certified Nurse-Midwife 12/23/2014 2:08 PM

## 2014-12-23 NOTE — Discharge Instructions (Signed)
Health Maintenance, Female Adopting a healthy lifestyle and getting preventive care can go a long way to promote health and wellness. Talk with your health care provider about what schedule of regular examinations is right for you. This is a good chance for you to check in with your provider about disease prevention and staying healthy. In between checkups, there are plenty of things you can do on your own. Experts have done a lot of research about which lifestyle changes and preventive measures are most likely to keep you healthy. Ask your health care provider for more information. WEIGHT AND DIET  Eat a healthy diet  Be sure to include plenty of vegetables, fruits, low-fat dairy products, and lean protein.  Do not eat a lot of foods high in solid fats, added sugars, or salt.  Get regular exercise. This is one of the most important things you can do for your health.  Most adults should exercise for at least 150 minutes each week. The exercise should increase your heart rate and make you sweat (moderate-intensity exercise).  Most adults should also do strengthening exercises at least twice a week. This is in addition to the moderate-intensity exercise.  Maintain a healthy weight  Body mass index (BMI) is a measurement that can be used to identify possible weight problems. It estimates body fat based on height and weight. Your health care provider can help determine your BMI and help you achieve or maintain a healthy weight.  For females 58 years of age and older:   A BMI below 18.5 is considered underweight.  A BMI of 18.5 to 24.9 is normal.  A BMI of 25 to 29.9 is considered overweight.  A BMI of 30 and above is considered obese.  Watch levels of cholesterol and blood lipids  You should start having your blood tested for lipids and cholesterol at 30 years of age, then have this test every 5 years.  You may need to have your cholesterol levels checked more often if:  Your lipid  or cholesterol levels are high.  You are older than 30 years of age.  You are at high risk for heart disease.  CANCER SCREENING   Lung Cancer  Lung cancer screening is recommended for adults 1-58 years old who are at high risk for lung cancer because of a history of smoking.  A yearly low-dose CT scan of the lungs is recommended for people who:  Currently smoke.  Have quit within the past 15 years.  Have at least a 30-pack-year history of smoking. A pack year is smoking an average of one pack of cigarettes a day for 1 year.  Yearly screening should continue until it has been 15 years since you quit.  Yearly screening should stop if you develop a health problem that would prevent you from having lung cancer treatment.  Breast Cancer  Practice breast self-awareness. This means understanding how your breasts normally appear and feel.  It also means doing regular breast self-exams. Let your health care provider know about any changes, no matter how small.  If you are in your 20s or 30s, you should have a clinical breast exam (CBE) by a health care provider every 1-3 years as part of a regular health exam.  If you are 68 or older, have a CBE every year. Also consider having a breast X-ray (mammogram) every year.  If you have a family history of breast cancer, talk to your health care provider about genetic screening.  If you  are at high risk for breast cancer, talk to your health care provider about having an MRI and a mammogram every year.  Breast cancer gene (BRCA) assessment is recommended for women who have family members with BRCA-related cancers. BRCA-related cancers include:  Breast.  Ovarian.  Tubal.  Peritoneal cancers.  Results of the assessment will determine the need for genetic counseling and BRCA1 and BRCA2 testing. Cervical Cancer Your health care provider may recommend that you be screened regularly for cancer of the pelvic organs (ovaries, uterus, and  vagina). This screening involves a pelvic examination, including checking for microscopic changes to the surface of your cervix (Pap test). You may be encouraged to have this screening done every 3 years, beginning at age 21.  For women ages 30-65, health care providers may recommend pelvic exams and Pap testing every 3 years, or they may recommend the Pap and pelvic exam, combined with testing for human papilloma virus (HPV), every 5 years. Some types of HPV increase your risk of cervical cancer. Testing for HPV may also be done on women of any age with unclear Pap test results.  Other health care providers may not recommend any screening for nonpregnant women who are considered low risk for pelvic cancer and who do not have symptoms. Ask your health care provider if a screening pelvic exam is right for you.  If you have had past treatment for cervical cancer or a condition that could lead to cancer, you need Pap tests and screening for cancer for at least 20 years after your treatment. If Pap tests have been discontinued, your risk factors (such as having a new sexual partner) need to be reassessed to determine if screening should resume. Some women have medical problems that increase the chance of getting cervical cancer. In these cases, your health care provider may recommend more frequent screening and Pap tests. Colorectal Cancer  This type of cancer can be detected and often prevented.  Routine colorectal cancer screening usually begins at 30 years of age and continues through 30 years of age.  Your health care provider may recommend screening at an earlier age if you have risk factors for colon cancer.  Your health care provider may also recommend using home test kits to check for hidden blood in the stool.  A small camera at the end of a tube can be used to examine your colon directly (sigmoidoscopy or colonoscopy). This is done to check for the earliest forms of colorectal  cancer.  Routine screening usually begins at age 50.  Direct examination of the colon should be repeated every 5-10 years through 30 years of age. However, you may need to be screened more often if early forms of precancerous polyps or small growths are found. Skin Cancer  Check your skin from head to toe regularly.  Tell your health care provider about any new moles or changes in moles, especially if there is a change in a mole's shape or color.  Also tell your health care provider if you have a mole that is larger than the size of a pencil eraser.  Always use sunscreen. Apply sunscreen liberally and repeatedly throughout the day.  Protect yourself by wearing long sleeves, pants, a wide-brimmed hat, and sunglasses whenever you are outside. HEART DISEASE, DIABETES, AND HIGH BLOOD PRESSURE   High blood pressure causes heart disease and increases the risk of stroke. High blood pressure is more likely to develop in:  People who have blood pressure in the high end   of the normal range (130-139/85-89 mm Hg).  People who are overweight or obese.  People who are African American.  If you are 18-39 years of age, have your blood pressure checked every 3-5 years. If you are 40 years of age or older, have your blood pressure checked every year. You should have your blood pressure measured twice--once when you are at a hospital or clinic, and once when you are not at a hospital or clinic. Record the average of the two measurements. To check your blood pressure when you are not at a hospital or clinic, you can use:  An automated blood pressure machine at a pharmacy.  A home blood pressure monitor.  If you are between 55 years and 79 years old, ask your health care provider if you should take aspirin to prevent strokes.  Have regular diabetes screenings. This involves taking a blood sample to check your fasting blood sugar level.  If you are at a normal weight and have a low risk for diabetes,  have this test once every three years after 30 years of age.  If you are overweight and have a high risk for diabetes, consider being tested at a younger age or more often. PREVENTING INFECTION  Hepatitis B  If you have a higher risk for hepatitis B, you should be screened for this virus. You are considered at high risk for hepatitis B if:  You were born in a country where hepatitis B is common. Ask your health care provider which countries are considered high risk.  Your parents were born in a high-risk country, and you have not been immunized against hepatitis B (hepatitis B vaccine).  You have HIV or AIDS.  You use needles to inject street drugs.  You live with someone who has hepatitis B.  You have had sex with someone who has hepatitis B.  You get hemodialysis treatment.  You take certain medicines for conditions, including cancer, organ transplantation, and autoimmune conditions. Hepatitis C  Blood testing is recommended for:  Everyone born from 1945 through 1965.  Anyone with known risk factors for hepatitis C. Sexually transmitted infections (STIs)  You should be screened for sexually transmitted infections (STIs) including gonorrhea and chlamydia if:  You are sexually active and are younger than 30 years of age.  You are older than 30 years of age and your health care provider tells you that you are at risk for this type of infection.  Your sexual activity has changed since you were last screened and you are at an increased risk for chlamydia or gonorrhea. Ask your health care provider if you are at risk.  If you do not have HIV, but are at risk, it may be recommended that you take a prescription medicine daily to prevent HIV infection. This is called pre-exposure prophylaxis (PrEP). You are considered at risk if:  You are sexually active and do not regularly use condoms or know the HIV status of your partner(s).  You take drugs by injection.  You are sexually  active with a partner who has HIV. Talk with your health care provider about whether you are at high risk of being infected with HIV. If you choose to begin PrEP, you should first be tested for HIV. You should then be tested every 3 months for as long as you are taking PrEP.  PREGNANCY   If you are premenopausal and you may become pregnant, ask your health care provider about preconception counseling.  If you may   become pregnant, take 400 to 800 micrograms (mcg) of folic acid every day.  If you want to prevent pregnancy, talk to your health care provider about birth control (contraception). OSTEOPOROSIS AND MENOPAUSE   Osteoporosis is a disease in which the bones lose minerals and strength with aging. This can result in serious bone fractures. Your risk for osteoporosis can be identified using a bone density scan.  If you are 65 years of age or older, or if you are at risk for osteoporosis and fractures, ask your health care provider if you should be screened.  Ask your health care provider whether you should take a calcium or vitamin D supplement to lower your risk for osteoporosis.  Menopause may have certain physical symptoms and risks.  Hormone replacement therapy may reduce some of these symptoms and risks. Talk to your health care provider about whether hormone replacement therapy is right for you.  HOME CARE INSTRUCTIONS   Schedule regular health, dental, and eye exams.  Stay current with your immunizations.   Do not use any tobacco products including cigarettes, chewing tobacco, or electronic cigarettes.  If you are pregnant, do not drink alcohol.  If you are breastfeeding, limit how much and how often you drink alcohol.  Limit alcohol intake to no more than 1 drink per day for nonpregnant women. One drink equals 12 ounces of beer, 5 ounces of wine, or 1 ounces of hard liquor.  Do not use street drugs.  Do not share needles.  Ask your health care provider for help if  you need support or information about quitting drugs.  Tell your health care provider if you often feel depressed.  Tell your health care provider if you have ever been abused or do not feel safe at home.   This information is not intended to replace advice given to you by your health care provider. Make sure you discuss any questions you have with your health care provider.   Document Released: 07/21/2010 Document Revised: 01/26/2014 Document Reviewed: 12/07/2012 Elsevier Interactive Patient Education 2016 [employer] Inc[employer]  

## 2014-12-23 NOTE — MAU Note (Signed)
Has a d/c, for wks.  Clear, to white/yellow;slight odor, some irritation

## 2014-12-24 LAB — GC/CHLAMYDIA PROBE AMP (~~LOC~~) NOT AT ARMC
Chlamydia: NEGATIVE
Neisseria Gonorrhea: NEGATIVE

## 2015-08-11 ENCOUNTER — Encounter (HOSPITAL_COMMUNITY): Payer: Self-pay | Admitting: *Deleted

## 2015-08-11 ENCOUNTER — Inpatient Hospital Stay (HOSPITAL_COMMUNITY)
Admission: AD | Admit: 2015-08-11 | Discharge: 2015-08-11 | Disposition: A | Discharge status: Home / Self Care | Payer: Self-pay | Source: Ambulatory Visit | Attending: Obstetrics and Gynecology | Admitting: Obstetrics and Gynecology

## 2015-08-11 DIAGNOSIS — N760 Acute vaginitis: Secondary | ICD-10-CM | POA: Insufficient documentation

## 2015-08-11 LAB — WET PREP, GENITAL
CLUE CELLS WET PREP: NONE SEEN
Sperm: NONE SEEN
TRICH WET PREP: NONE SEEN
WBC, Wet Prep HPF POC: NONE SEEN
Yeast Wet Prep HPF POC: NONE SEEN

## 2015-08-11 LAB — URINALYSIS, ROUTINE W REFLEX MICROSCOPIC
Bilirubin Urine: NEGATIVE
Glucose, UA: NEGATIVE mg/dL
HGB URINE DIPSTICK: NEGATIVE
KETONES UR: NEGATIVE mg/dL
LEUKOCYTES UA: NEGATIVE
Nitrite: NEGATIVE
PROTEIN: NEGATIVE mg/dL
Specific Gravity, Urine: <1.005 — ABNORMAL LOW (ref 1.005–1.030)
pH: 6 (ref 5.0–8.0)

## 2015-08-11 LAB — POCT PREGNANCY, URINE: PREG TEST UR: NEGATIVE

## 2015-08-11 MED ORDER — FLUCONAZOLE 150 MG PO TABS
150.0000 mg | ORAL_TABLET | Freq: Once | ORAL | Status: AC
  Administered 2015-08-11: 150 mg via ORAL
  Filled 2015-08-11: qty 1

## 2015-08-11 NOTE — MAU Provider Note (Signed)
History   [id number]   Chief Complaint  Patient presents with  . Vaginitis    HPI Olivia Lawson is a 30 y.o. female  [phone] here with report of vaginal discharge that started 3 weeks ago, given an antibiotic and now has vaginal irritation.  Tried monistat which did not help with itching.  Denies vaginal bleeding or pelvic pain.  Declines HIV and RPR screen.  Patient's last menstrual period was 07/11/2015.  OB History  Gravida Para Term Preterm AB Living  0 1 3  SAB TAB Ectopic Multiple Live Births  1 0 0 0      # Outcome Date GA Lbr Len/2nd Weight Sex Delivery Anes PTL Lv  4 Term 08/28/10 [redacted]w[redacted]d 13:08 / 00:18 6 lb 8.9 oz (2.975 kg) M Vag-Spont None  LIV  3 SAB           2 Term         LIV  1 Term         LIV      Past Medical History:  Diagnosis Date  . Migraine   . Placental abruption     History reviewed. No pertinent family history.  Social History   Social History  . Marital status: Married    Spouse name: N/A  . Number of children: N/A  . Years of education: N/A   Social History Main Topics  . Smoking status: Never Smoker  . Smokeless tobacco: Never Used  . Alcohol use No  . Drug use: No  . Sexual activity: Yes    Birth control/ protection: None   Other Topics Concern  . None   Social History Narrative  . None    Allergies  Allergen Reactions  . Monistat [Miconazole] Swelling and Rash    Ampule, 1 day insert.     No current facility-administered medications on file prior to encounter.    No current outpatient prescriptions on file prior to encounter.     Review of Systems  Genitourinary: Positive for vaginal discharge and vaginal pain (itching). Negative for dysuria, frequency, genital sores, menstrual problem, pelvic pain and vaginal bleeding.  All other systems reviewed and are negative.    Physical Exam   Vitals:   08/11/15 2050  BP: 114/66  Pulse: (!) 54  Resp: 18  Temp: 98.2 F (36.8 C)  TempSrc: Oral  Weight: 172  lb (78 kg)  Height: 5' 6.5" (1.689 m)    Physical Exam  Constitutional: She is oriented to person, place, and time. She appears well-developed and well-nourished. No distress.  HENT:  Head: Normocephalic.  Neck: Normal range of motion. Neck supple.  Cardiovascular: Normal rate and regular rhythm.   Respiratory: Effort normal and breath sounds normal.  GI: Soft. There is no tenderness.  Genitourinary:    Cervix exhibits no motion tenderness. Vaginal discharge (thick white cottage cheese appearing discharge) found.  Neurological: She is alert and oriented to person, place, and time.  Skin: Skin is warm and dry.    MAU Course  Procedures  MDM Results for orders placed or performed during the hospital encounter of 08/11/15 (from the past 24 hour(s))  Urinalysis, Routine w reflex microscopic (not at [facility])     Status: Abnormal   Collection Time: 08/11/15  8:40 PM  Result Value Ref Range   Color, Urine YELLOW YELLOW   APPearance CLEAR CLEAR   Specific Gravity, Urine <1.005 (L) 1.005 - 1.030   pH 6.0 5.0 - 8.0  Glucose, UA NEGATIVE NEGATIVE mg/dL   Hgb urine dipstick NEGATIVE NEGATIVE   Bilirubin Urine NEGATIVE NEGATIVE   Ketones, ur NEGATIVE NEGATIVE mg/dL   Protein, ur NEGATIVE NEGATIVE mg/dL   Nitrite NEGATIVE NEGATIVE   Leukocytes, UA NEGATIVE NEGATIVE  Pregnancy, urine POC     Status: None   Collection Time: 08/11/15  9:05 PM  Result Value Ref Range   Preg Test, Ur NEGATIVE NEGATIVE  Wet prep, genital     Status: None   Collection Time: 08/11/15  9:08 PM  Result Value Ref Range   Yeast Wet Prep HPF POC NONE SEEN NONE SEEN   Trich, Wet Prep NONE SEEN NONE SEEN   Clue Cells Wet Prep HPF POC NONE SEEN NONE SEEN   WBC, Wet Prep HPF POC NONE SEEN NONE SEEN   Sperm NONE SEEN      Assessment and Plan  Vaginitis  Plan: Discharge home HSV culture pending Follow-up prn  Marlis Edelson, CNM 08/11/2015 10:26 PM

## 2015-08-11 NOTE — Discharge Instructions (Signed)

## 2015-08-11 NOTE — MAU Note (Addendum)
Patient presents stating she doesn't think she is pregnant with c/o a vaginal discharge. Denies bleeding or pain.

## 2015-08-12 LAB — GC/CHLAMYDIA PROBE AMP (~~LOC~~) NOT AT ARMC
Chlamydia: NEGATIVE
NEISSERIA GONORRHEA: NEGATIVE

## 2015-08-15 LAB — HSV CULTURE AND TYPING

## 2015-08-18 ENCOUNTER — Encounter (HOSPITAL_COMMUNITY): Payer: Self-pay | Admitting: *Deleted

## 2015-08-18 ENCOUNTER — Inpatient Hospital Stay (HOSPITAL_COMMUNITY)
Admission: AD | Admit: 2015-08-18 | Discharge: 2015-08-18 | Disposition: A | Discharge status: Home / Self Care | Payer: Self-pay | Source: Ambulatory Visit | Attending: Obstetrics & Gynecology | Admitting: Obstetrics & Gynecology

## 2015-08-18 DIAGNOSIS — L290 Pruritus ani: Secondary | ICD-10-CM

## 2015-08-18 DIAGNOSIS — N760 Acute vaginitis: Secondary | ICD-10-CM | POA: Insufficient documentation

## 2015-08-18 DIAGNOSIS — L293 Anogenital pruritus, unspecified: Secondary | ICD-10-CM

## 2015-08-18 LAB — WET PREP, GENITAL
Clue Cells Wet Prep HPF POC: NONE SEEN
SPERM: NONE SEEN
Trich, Wet Prep: NONE SEEN
YEAST WET PREP: NONE SEEN

## 2015-08-18 MED ORDER — FLUCONAZOLE 150 MG PO TABS: 150.0000 mg | ORAL_TABLET | Freq: Once | ORAL | 0 refills | Status: AC

## 2015-08-18 MED ORDER — REPHRESH CLEAN BALANCE VA KIT: 1.0000 "application " | PACK | VAGINAL | 2 refills | Status: DC

## 2015-08-18 NOTE — Discharge Instructions (Signed)
Pruritus Pruritus is an itching feeling. There are many different conditions and factors that can make your skin itchy. Dry skin is one of the most common causes of itching. Most cases of itching do not require medical attention. Itchy skin can turn into a rash.  HOME CARE INSTRUCTIONS  Watch your pruritus for any changes. Take these steps to help with your condition:  Skin Care  Moisturize your skin as needed. A moisturizer that contains petroleum jelly is best for keeping moisture in your skin.  Take or apply medicines only as directed by your health care provider. This may include:  Corticosteroid cream.  Anti-itch lotions.  Oral anti-histamines.  Apply cool compresses to the affected areas.  Try taking a bath with:  Epsom salts. Follow the instructions on the packaging. You can get these at your local pharmacy or grocery store.  Baking soda. Pour a small amount into the bath as directed by your health care provider.  Colloidal oatmeal. Follow the instructions on the packaging. You can get this at your local pharmacy or grocery store.  Try applying baking soda paste to your skin. Stir water into baking soda until it reaches a paste-like consistency.   Do not scratch your skin.  Avoid hot showers or baths, which can make itching worse. A cold shower may help with itching as long as you use a moisturizer after.  Avoid scented soaps, detergents, and perfumes. Use gentle soaps, detergents, perfumes, and other cosmetic products. General Instructions  Avoid wearing tight clothes.  Keep a journal to help track what causes your itch. Write down:  What you eat.  What cosmetic products you use.  What you drink.  What you wear. This includes jewelry.  Use a humidifier. This keeps the air moist, which helps to prevent dry skin. SEEK MEDICAL CARE IF:  The itching does not go away after several days.  You sweat at night.  You have weight loss.  You are unusually  thirsty.  You urinate more than normal.  You are more tired than normal.  You have abdominal pain.  Your skin tingles.  You feel weak.  Your skin or the whites of your eyes look yellow (jaundice).  Your skin feels numb.   This information is not intended to replace advice given to you by your health care provider. Make sure you discuss any questions you have with your health care provider.   Document Released: 09/17/2010 Document Revised: 05/22/2014 Document Reviewed: 01/01/2014 Elsevier Interactive Patient Education 2016 [employer].  Vaginitis Vaginitis is an inflammation of the vagina. It is most often caused by a change in the normal balance of the bacteria and yeast that live in the vagina. This change in balance causes an overgrowth of certain bacteria or yeast, which causes the inflammation. There are different types of vaginitis, but the most common types are:  Bacterial vaginosis.  Yeast infection (candidiasis).  Trichomoniasis vaginitis. This is a sexually transmitted infection (STI).  Viral vaginitis.  Atrophic vaginitis.  Allergic vaginitis. CAUSES  The cause depends on the type of vaginitis. Vaginitis can be caused by:  Bacteria (bacterial vaginosis).  Yeast (yeast infection).  A parasite (trichomoniasis vaginitis)  A virus (viral vaginitis).  Low hormone levels (atrophic vaginitis). Low hormone levels can occur during pregnancy, breastfeeding, or after menopause.  Irritants, such as bubble baths, scented tampons, and feminine sprays (allergic vaginitis). Other factors can change the normal balance of the yeast and bacteria that live in the vagina. These include:  Antibiotic  medicines.  Poor hygiene.  Diaphragms, vaginal sponges, spermicides, birth control pills, and intrauterine devices (IUD).  Sexual intercourse.  Infection.  Uncontrolled diabetes.  A weakened immune system. SYMPTOMS  Symptoms can vary depending on the cause of the  vaginitis. Common symptoms include:  Abnormal vaginal discharge.  The discharge is white, gray, or yellow with bacterial vaginosis.  The discharge is thick, white, and cheesy with a yeast infection.  The discharge is frothy and yellow or greenish with trichomoniasis.  A bad vaginal odor.  The odor is fishy with bacterial vaginosis.  Vaginal itching, pain, or swelling.  Painful intercourse.  Pain or burning when urinating. Sometimes, there are no symptoms. TREATMENT  Treatment will vary depending on the type of infection.   Bacterial vaginosis and trichomoniasis are often treated with antibiotic creams or pills.  Yeast infections are often treated with antifungal medicines, such as vaginal creams or suppositories.  Viral vaginitis has no cure, but symptoms can be treated with medicines that relieve discomfort. Your sexual partner should be treated as well.  Atrophic vaginitis may be treated with an estrogen cream, pill, suppository, or vaginal ring. If vaginal dryness occurs, lubricants and moisturizing creams may help. You may be told to avoid scented soaps, sprays, or douches.  Allergic vaginitis treatment involves quitting the use of the product that is causing the problem. Vaginal creams can be used to treat the symptoms. HOME CARE INSTRUCTIONS   Take all medicines as directed by your caregiver.  Keep your genital area clean and dry. Avoid soap and only rinse the area with water.  Avoid douching. It can remove the healthy bacteria in the vagina.  Do not use tampons or have sexual intercourse until your vaginitis has been treated. Use sanitary pads while you have vaginitis.  Wipe from front to back. This avoids the spread of bacteria from the rectum to the vagina.  Let air reach your genital area.  Wear cotton underwear to decrease moisture buildup.  Avoid wearing underwear while you sleep until your vaginitis is gone.  Avoid tight pants and underwear or nylons  without a cotton panel.  Take off wet clothing (especially bathing suits) as soon as possible.  Use mild, non-scented products. Avoid using irritants, such as:  Scented feminine sprays.  Fabric softeners.  Scented detergents.  Scented tampons.  Scented soaps or bubble baths.  Practice safe sex and use condoms. Condoms may prevent the spread of trichomoniasis and viral vaginitis. SEEK MEDICAL CARE IF:   You have abdominal pain.  You have a fever or persistent symptoms for more than 2-3 days.  You have a fever and your symptoms suddenly get worse.   This information is not intended to replace advice given to you by your health care provider. Make sure you discuss any questions you have with your health care provider.   Document Released: 11/02/2006 Document Revised: 05/22/2014 Document Reviewed: 06/18/2011 Elsevier Interactive Patient Education [employer]

## 2015-08-18 NOTE — MAU Note (Signed)
Pt states she was seen a week ago for a yeast infection and it has gotten worse.

## 2015-08-18 NOTE — MAU Provider Note (Signed)
Chief Complaint:  Vaginitis   First Provider Initiated Contact with Patient 08/18/15 0815     HPI  Olivia Lawson is a 30 y.o. [id number] who presents to maternity admissions reporting perineal itching, primarily  "on the outside".  States she is convinced it is a yeast infection.  Shows a picture of thick white discharge on fingers.  States "I took it all out, so you won't find any in there".  Thinks one dose of Diflucan "wasn't enough" from prior treatment. Was treated with antibiotics and "hasn't been the same ever since".  Already taking probiotics daily.   Was seen here on 7/23 and tested for HSV which was negative.  Does have some condom and pad use, unsure if scented. She reports no vaginal bleeding, vaginal itching/burning, urinary symptoms, h/a, dizziness, n/v, or fever/chills.    Has multiple visits over the past year for the same complaint. States comes to ER because she does not have insurance or the copay required at Urgent Care.  Does not have a regular OB/GYN provider.  RN Note: Pt states she was seen a week ago for a yeast infection and it has gotten worse.  Past Medical History: Past Medical History:  Diagnosis Date  . Migraine   . Placental abruption     Past obstetric history: OB History  Gravida Para Term Preterm AB Living  0 1 3  SAB TAB Ectopic Multiple Live Births  1 0 0 0 3    # Outcome Date GA Lbr Len/2nd Weight Sex Delivery Anes PTL Lv  4 Term 08/28/10 [redacted]w[redacted]d 13:08 / 00:18 6 lb 8.9 oz (2.975 kg) M Vag-Spont None  LIV  3 Term 11/21/08     VBAC   LIV  2 Term 01/04/06     CS-LTranv   LIV     Complications: Placental abruption  1 SAB               Past Surgical History: Past Surgical History:  Procedure Laterality Date  . CESAREAN SECTION    . DENTAL SURGERY    . HERNIA REPAIR      Family History: History reviewed. No pertinent family history.  Social History: Social History  Substance Use Topics  . Smoking status: Never Smoker  . Smokeless  tobacco: Never Used  . Alcohol use No    Allergies:  Allergies  Allergen Reactions  . Monistat [Miconazole] Swelling and Rash    Ampule, 1 day insert.     Meds:  No prescriptions prior to admission.    I have reviewed patient's Past Medical Hx, Surgical Hx, Family Hx, Social Hx, medications and allergies.  ROS:  Review of Systems  Constitutional: Negative for chills, fatigue and fever.  Gastrointestinal: Negative for abdominal pain, constipation, diarrhea, nausea and vomiting.  Genitourinary: Positive for vaginal discharge. Negative for dysuria, menstrual problem and pelvic pain.   Other systems negative     Physical Exam  Patient Vitals for the past 24 hrs:  BP Temp Temp src Pulse Resp  08/18/15 0800 106/61 98.4 F (36.9 C) Oral (!) 43 16   Constitutional: Well-developed, well-nourished female in no acute distress.  Cardiovascular: normal rate and rhythm Respiratory: normal effort, no distress. GI: Abd soft, non-tender.  Nondistended.  No rebound, No guarding.   MS: Extremities nontender, no edema, normal ROM Neurologic: Alert and oriented x 4.   Grossly nonfocal. GU: Neg CVAT. Skin:  Warm and Dry Psych:  Affect appropriate.  PELVIC EXAM: Cervix pink,  visually closed, without lesion, scant, almost no discharge, vaginal walls and external genitalia normal, no erethema or lesions    Labs: Results for orders placed or performed during the hospital encounter of 08/18/15 (from the past 24 hour(s))  Wet prep, genital     Status: Abnormal   Collection Time: 08/18/15  8:17 AM  Result Value Ref Range   Yeast Wet Prep HPF POC NONE SEEN NONE SEEN   Trich, Wet Prep NONE SEEN NONE SEEN   Clue Cells Wet Prep HPF POC NONE SEEN NONE SEEN   WBC, Wet Prep HPF POC FEW (A) NONE SEEN   Sperm NONE SEEN     Imaging:  No results found.  MAU Course/MDM: I have ordered labs as follows: Wet prep Imaging ordered: none Results reviewed.   Pt stable at time of  discharge.  Assessment: Vaginal itching Chronic vaginitis complaints Question allergy/contact dermatitis  Plan: Discharge home Recommend Try 24 hours of OTC cortisone cream to vulva only.  No scented or Always brand pads.  Continue probiotics, add Kombucha.   Rx sent for Diflucan for intractable vaginal itching, no refills Discussed this is not necessarily yeast, though it could be subclinical.  Vaginal hygeine discussed. Discussed vaginal discharge can be normal throughout the month.  Discussed can use [facility] for vaginitis exams  Safe sex    Medication List    You have not been prescribed any medications.    Encouraged to return here or to other Urgent Care/ED if she develops worsening of symptoms, increase in pain, fever, or other concerning symptoms.   Wynelle Bourgeois CNM, MSN Certified Nurse-Midwife 08/18/2015 8:15 AM

## 2015-10-06 ENCOUNTER — Inpatient Hospital Stay (HOSPITAL_COMMUNITY)
Admission: AD | Admit: 2015-10-06 | Discharge: 2015-10-06 | Disposition: A | Discharge status: Home / Self Care | Payer: Self-pay | Source: Ambulatory Visit | Attending: Obstetrics and Gynecology | Admitting: Obstetrics and Gynecology

## 2015-10-06 DIAGNOSIS — N898 Other specified noninflammatory disorders of vagina: Secondary | ICD-10-CM | POA: Insufficient documentation

## 2015-10-06 DIAGNOSIS — Z3202 Encounter for pregnancy test, result negative: Secondary | ICD-10-CM | POA: Insufficient documentation

## 2015-10-06 LAB — POCT PREGNANCY, URINE: PREG TEST UR: NEGATIVE

## 2015-10-06 LAB — WET PREP, GENITAL
Clue Cells Wet Prep HPF POC: NONE SEEN
Sperm: NONE SEEN
Trich, Wet Prep: NONE SEEN
Yeast Wet Prep HPF POC: NONE SEEN

## 2015-10-06 NOTE — MAU Note (Signed)
Vaginal order x 3 days.  Noticed lymph nodes swollen. Douche x 2 weeks ago.  Last intercourse 1 week ago. Same sex partner x 12 yrs

## 2015-10-06 NOTE — MAU Provider Note (Signed)
History     CSN: [id number]  Arrival date and time: 10/06/15 1423   First Provider Initiated Contact with Patient 10/06/15 1507      Chief Complaint  Patient presents with  . Vaginal Discharge   HPI  Olivia Lawson is a 30 y.o. [id number]. She presents with increased vaginal discharge. It is white, thin or thick. Has an odor, can't describe it. No itch/burn/irritation. She noticed her lymph node in left groin is enlarged, bean size, sl tender. No fever/chills. No UTI S&S. She has sl nausea, LMP was 8/5, UPT is neg,nothing for [facility]. One partner x 13+ yr.   OB History    Gravida Para Term Preterm AB Living   _0 0 1 3   SAB TAB Ectopic Multiple Live Births   1 0 0 0 3      Past Medical History:  Diagnosis Date  . Migraine   . Placental abruption     Past Surgical History:  Procedure Laterality Date  . CESAREAN SECTION    . DENTAL SURGERY    . HERNIA REPAIR      No family history on file.  Social History  Substance Use Topics  . Smoking status: Never Smoker  . Smokeless tobacco: Never Used  . Alcohol use No    Allergies:  Allergies  Allergen Reactions  . Monistat [Miconazole] Swelling and Rash    Ampule, 1 day insert.     Prescriptions Prior to Admission  Medication Sig Dispense Refill Last Dose  . Miscellaneous Vaginal Products (REPHRESH CLEAN BALANCE) KIT Place 1 application vaginally once a week. (Patient not taking: Reported on 10/06/2015) 1 kit 2 Not Taking at Unknown time    Review of Systems  Constitutional: Negative for chills and fever.  Gastrointestinal: Positive for nausea. Negative for abdominal pain, heartburn and vomiting.  Genitourinary: Negative for dysuria, frequency and urgency.       White vaginal discharge Inguinal lymph node enlarged, tender   Physical Exam   Blood pressure 117/70, pulse (!) 59, temperature 98.5 F (36.9 C), resp. rate 18, last menstrual period 08/24/2015.  Physical Exam  Nursing note and vitals  reviewed. Constitutional: She appears well-developed and well-nourished. No distress.  GI: Soft. There is no tenderness.  Genitourinary:  Genitourinary Comments: Pelvic exam- Ext gen- nl anatomy, skin intact. Solitary 4-5 mm left inguinal lymph node. Vagina- mod amt white mucoid discharge Cx- parous, non tender Uterus- non tender, nl size And- non tender, no masses palp  Musculoskeletal: Normal range of motion.  Neurological: She is alert.  Skin: Skin is warm and dry.  Psychiatric: She has a normal mood and affect.    MAU Course  Procedures  MDM Results for orders placed or performed during the hospital encounter of 10/06/15 (from the past 24 hour(s))  Pregnancy, urine POC     Status: None   Collection Time: 10/06/15  2:44 PM  Result Value Ref Range   Preg Test, Ur NEGATIVE NEGATIVE  Wet prep, genital     Status: Abnormal   Collection Time: 10/06/15  3:15 PM  Result Value Ref Range   Yeast Wet Prep HPF POC NONE SEEN NONE SEEN   Trich, Wet Prep NONE SEEN NONE SEEN   Clue Cells Wet Prep HPF POC NONE SEEN NONE SEEN   WBC, Wet Prep HPF POC FEW (A) NONE SEEN   Sperm NONE SEEN      Assessment and Plan  Wet prep is neg, GC/CT pending Feminine hygiene reviewed  PPA for annual exam, PAP and contraception if desires Start PNV daily until on [facility] [place] M. 10/06/2015, 3:23 PM

## 2015-10-07 LAB — GC/CHLAMYDIA PROBE AMP (~~LOC~~) NOT AT ARMC
Chlamydia: NEGATIVE
Neisseria Gonorrhea: NEGATIVE

## 2017-11-28 ENCOUNTER — Encounter (HOSPITAL_COMMUNITY): Payer: Self-pay

## 2017-11-28 ENCOUNTER — Inpatient Hospital Stay (HOSPITAL_COMMUNITY)
Admission: AD | Admit: 2017-11-28 | Discharge: 2017-11-28 | Disposition: A | Discharge status: Home / Self Care | Payer: Medicaid Other | Source: Ambulatory Visit | Attending: Obstetrics & Gynecology | Admitting: Obstetrics & Gynecology

## 2017-11-28 DIAGNOSIS — M79606 Pain in leg, unspecified: Secondary | ICD-10-CM | POA: Diagnosis not present

## 2017-11-28 DIAGNOSIS — R519 Headache, unspecified: Secondary | ICD-10-CM | POA: Diagnosis present

## 2017-11-28 DIAGNOSIS — R51 Headache: Secondary | ICD-10-CM | POA: Insufficient documentation

## 2017-11-28 DIAGNOSIS — N898 Other specified noninflammatory disorders of vagina: Secondary | ICD-10-CM | POA: Diagnosis present

## 2017-11-28 DIAGNOSIS — G8929 Other chronic pain: Secondary | ICD-10-CM | POA: Diagnosis present

## 2017-11-28 LAB — WET PREP, GENITAL
Clue Cells Wet Prep HPF POC: NONE SEEN
SPERM: NONE SEEN
Trich, Wet Prep: NONE SEEN
YEAST WET PREP: NONE SEEN

## 2017-11-28 LAB — URINALYSIS, ROUTINE W REFLEX MICROSCOPIC
Bilirubin Urine: NEGATIVE
GLUCOSE, UA: NEGATIVE mg/dL
Hgb urine dipstick: NEGATIVE
Ketones, ur: NEGATIVE mg/dL
Leukocytes, UA: NEGATIVE
Nitrite: NEGATIVE
PH: 5 (ref 5.0–8.0)
PROTEIN: NEGATIVE mg/dL
SPECIFIC GRAVITY, URINE: 1.021 (ref 1.005–1.030)

## 2017-11-28 LAB — POCT PREGNANCY, URINE: Preg Test, Ur: NEGATIVE

## 2017-11-28 MED ORDER — CYCLOBENZAPRINE HCL 10 MG PO TABS: 10.0000 mg | ORAL_TABLET | Freq: Once | ORAL | Status: DC

## 2017-11-28 NOTE — MAU Note (Signed)
Arita Miss CNM called pt to triage, pt not in lobby

## 2017-11-28 NOTE — MAU Provider Note (Signed)
Chief Complaint: Vaginal Discharge; Headache; and Leg Pain   First Provider Initiated Contact with Patient 11/28/17 1104      SUBJECTIVE HPI: Ms. Olivia Lawson is a 32 y.o. [phone] who presents to maternity admissions for white vaginal d/c with a "slight" fishy odor, cramping in upper thighs and H/A off and on since 10/25/17. She reports she has not been drinking that much water around that time, but has since increased her intake to "1/2 body weight in water daily." She does not have a GYN provider.    Past Medical History:  Diagnosis Date  . Migraine   . Placental abruption    Past Surgical History:  Procedure Laterality Date  . CESAREAN SECTION    . DENTAL SURGERY    . HERNIA REPAIR     Social History   Socioeconomic History  . Marital status: Married    Spouse name: Not on file  . Number of children: Not on file  . Years of education: Not on file  . Highest education level: Not on file  Occupational History  . Not on file  Social Needs  . Financial resource strain: Not on file  . Food insecurity:    Worry: Not on file    Inability: Not on file  . Transportation needs:    Medical: Not on file    Non-medical: Not on file  Tobacco Use  . Smoking status: Never Smoker  . Smokeless tobacco: Never Used  Substance and Sexual Activity  . Alcohol use: No  . Drug use: No  . Sexual activity: Yes    Birth control/protection: None  Lifestyle  . Physical activity:    Days per week: Not on file    Minutes per session: Not on file  . Stress: Not on file  Relationships  . Social connections:    Talks on phone: Not on file    Gets together: Not on file    Attends religious service: Not on file    Active member of club or organization: Not on file    Attends meetings of clubs or organizations: Not on file    Relationship status: Not on file  . Intimate partner violence:    Fear of current or ex partner: Not on file    Emotionally abused: Not on file    Physically abused:  Not on file    Forced sexual activity: Not on file  Other Topics Concern  . Not on file  Social History Narrative  . Not on file   No current facility-administered medications on file prior to encounter.    Current Outpatient Medications on File Prior to Encounter  Medication Sig Dispense Refill  . Miscellaneous Vaginal Products (REPHRESH CLEAN BALANCE) KIT Place 1 application vaginally once a week. (Patient not taking: Reported on 10/06/2015) 1 kit 2   Allergies  Allergen Reactions  . Monistat [Miconazole] Swelling and Rash    Ampule, 1 day insert.     ROS:  Review of Systems  Constitutional: Negative.   HENT: Negative.   Eyes: Negative.   Respiratory: Negative.   Cardiovascular: Negative.   Gastrointestinal: Negative.   Endocrine: Negative.   Genitourinary: Positive for vaginal discharge (white with "fishy odor").  Musculoskeletal: Positive for myalgias (cramping in upper thighs).  Skin: Negative.   Allergic/Immunologic: Negative.   Neurological: Positive for headaches.  Hematological: Negative.   Psychiatric/Behavioral: Negative.     I have reviewed patient's Past Medical Hx, Surgical Hx, Family Hx, Social Hx, medications and  allergies.   Physical Exam   Patient Vitals for the past 24 hrs:  BP Temp Temp src Pulse Resp Weight  11/28/17 1020 113/69 98.4 F (36.9 C) Oral (!) 49 18 78.1 kg   Physical Exam  Nursing note and vitals reviewed. Constitutional: She appears well-developed and well-nourished.    MDM Patient denies any concerning symptoms in need of emergent evaluation. Reassurance given that swab test was negative for vaginal infection with cultures pending. Patient advised that she may seek non-emergent evaluation of her complaint at [facility], Urgent care or her PCP.   ASSESSMENT MSE Complete  PLAN Discharge patient at her request to seek non-emergent medical care elsewhere. GYN Provider list given.  Laury Deep, CNM 11/28/2017 11:08 AM

## 2017-11-28 NOTE — MAU Note (Signed)
White discharge since 10/31/2017, reports a slight "fish odor"  Cramping in thighs  Having headaches, states it is mild today

## 2017-11-29 LAB — GC/CHLAMYDIA PROBE AMP (~~LOC~~) NOT AT ARMC
Chlamydia: NEGATIVE
Neisseria Gonorrhea: NEGATIVE

## 2018-12-08 ENCOUNTER — Other Ambulatory Visit: Payer: Self-pay

## 2018-12-08 DIAGNOSIS — Z20822 Contact with and (suspected) exposure to covid-19: Secondary | ICD-10-CM

## 2018-12-10 LAB — NOVEL CORONAVIRUS, NAA: SARS-CoV-2, NAA: NOT DETECTED

## 2019-03-29 ENCOUNTER — Inpatient Hospital Stay (HOSPITAL_COMMUNITY)
Admission: AD | Admit: 2019-03-29 | Discharge: 2019-03-29 | Disposition: A | Discharge status: Home / Self Care | Payer: Self-pay | Attending: Obstetrics and Gynecology | Admitting: Obstetrics and Gynecology

## 2019-03-29 ENCOUNTER — Other Ambulatory Visit: Payer: Self-pay

## 2019-03-29 DIAGNOSIS — R599 Enlarged lymph nodes, unspecified: Secondary | ICD-10-CM

## 2019-03-29 DIAGNOSIS — Z139 Encounter for screening, unspecified: Secondary | ICD-10-CM

## 2019-03-29 DIAGNOSIS — R1030 Lower abdominal pain, unspecified: Secondary | ICD-10-CM | POA: Insufficient documentation

## 2019-03-29 DIAGNOSIS — R102 Pelvic and perineal pain: Secondary | ICD-10-CM

## 2019-03-29 LAB — POCT PREGNANCY, URINE: Preg Test, Ur: NEGATIVE

## 2019-03-29 NOTE — Discharge Instructions (Signed)
Preventive Care 21-35 Years Old, Female Preventive care refers to visits with your health care provider and lifestyle choices that can promote health and wellness. This includes:  A yearly physical exam. This may also be called an annual well check.  Regular dental visits and eye exams.  Immunizations.  Screening for certain conditions.  Healthy lifestyle choices, such as eating a healthy diet, getting regular exercise, not using drugs or products that contain nicotine and tobacco, and limiting alcohol use. What can I expect for my preventive care visit? Physical exam Your health care provider will check your:  Height and weight. This may be used to calculate body mass index (BMI), which tells if you are at a healthy weight.  Heart rate and blood pressure.  Skin for abnormal spots. Counseling Your health care provider may ask you questions about your:  Alcohol, tobacco, and drug use.  Emotional well-being.  Home and relationship well-being.  Sexual activity.  Eating habits.  Work and work environment.  Method of birth control.  Menstrual cycle.  Pregnancy history. What immunizations do I need?  Influenza (flu) vaccine  This is recommended every year. Tetanus, diphtheria, and pertussis (Tdap) vaccine  You may need a Td booster every 10 years. Varicella (chickenpox) vaccine  You may need this if you have not been vaccinated. Human papillomavirus (HPV) vaccine  If recommended by your health care provider, you may need three doses over 6 months. Measles, mumps, and rubella (MMR) vaccine  You may need at least one dose of MMR. You may also need a second dose. Meningococcal conjugate (MenACWY) vaccine  One dose is recommended if you are age 19-21 years and a first-year college student living in a residence hall, or if you have one of several medical conditions. You may also need additional booster doses. Pneumococcal conjugate (PCV13) vaccine  You may need  this if you have certain conditions and were not previously vaccinated. Pneumococcal polysaccharide (PPSV23) vaccine  You may need one or two doses if you smoke cigarettes or if you have certain conditions. Hepatitis A vaccine  You may need this if you have certain conditions or if you travel or work in places where you may be exposed to hepatitis A. Hepatitis B vaccine  You may need this if you have certain conditions or if you travel or work in places where you may be exposed to hepatitis B. Haemophilus influenzae type b (Hib) vaccine  You may need this if you have certain conditions. You may receive vaccines as individual doses or as more than one vaccine together in one shot (combination vaccines). Talk with your health care provider about the risks and benefits of combination vaccines. What tests do I need?  Blood tests  Lipid and cholesterol levels. These may be checked every 5 years starting at age 20.  Hepatitis C test.  Hepatitis B test. Screening  Diabetes screening. This is done by checking your blood sugar (glucose) after you have not eaten for a while (fasting).  Sexually transmitted disease (STD) testing.  BRCA-related cancer screening. This may be done if you have a family history of breast, ovarian, tubal, or peritoneal cancers.  Pelvic exam and Pap test. This may be done every 3 years starting at age 21. Starting at age 30, this may be done every 5 years if you have a Pap test in combination with an HPV test. Talk with your health care provider about your test results, treatment options, and if necessary, the need for more tests.   Follow these instructions at home: Eating and drinking   Eat a diet that includes fresh fruits and vegetables, whole grains, lean protein, and low-fat dairy.  Take vitamin and mineral supplements as recommended by your health care provider.  Do not drink alcohol if: ? Your health care provider tells you not to drink. ? You are  pregnant, may be pregnant, or are planning to become pregnant.  If you drink alcohol: ? Limit how much you have to 0-1 drink a day. ? Be aware of how much alcohol is in your drink. In the [place] one drink equals one 12 oz bottle of beer (355 mL), one 5 oz glass of wine (148 mL), or one 1 oz glass of hard liquor (44 mL). Lifestyle  Take daily care of your teeth and gums.  Stay active. Exercise for at least 30 minutes on 5 or more days each week.  Do not use any products that contain nicotine or tobacco, such as cigarettes, e-cigarettes, and chewing tobacco. If you need help quitting, ask your health care provider.  If you are sexually active, practice safe sex. Use a condom or other form of birth control (contraception) in order to prevent pregnancy and STIs (sexually transmitted infections). If you plan to become pregnant, see your health care provider for a preconception visit. What's next?  Visit your health care provider once a year for a well check visit.  Ask your health care provider how often you should have your eyes and teeth checked.  Stay up to date on all vaccines. This information is not intended to replace advice given to you by your health care provider. Make sure you discuss any questions you have with your health care provider. Document Revised: 09/16/2017 Document Reviewed: 09/16/2017 Elsevier Patient Education  2020 [employer].

## 2019-03-29 NOTE — MAU Note (Signed)
Olivia Lawson is a 34 y.o. here in MAU reporting: right and left inguinal pain for the past 1.5 weeks. Was recently tested for STD at the [facility], is waiting for HIV testing to come back.   LMP: 02/19/19  Onset of complaint: ongoing  Pain score: 3/10  Vitals:   03/29/19 1014  BP: 119/74  Pulse: (!) 59  Resp: 16  Temp: 99.2 F (37.3 C)  SpO2: 99%     Lab orders placed from triage: UPT

## 2019-03-29 NOTE — MAU Note (Signed)
Discharged by Elvina Mattes out of triage

## 2019-03-29 NOTE — MAU Provider Note (Signed)
First Provider Initiated Contact with Patient 03/29/19 1029      S Ms. LANIESHA DAS is a 34 y.o. [phone] non-pregnant female who presents to MAU today with complaint of inguinal pain and and enlarged lymph nodes and vulvar lesions.    O BP 119/74 (BP Location: Right Arm)   Pulse (!) 59   Temp 99.2 F (37.3 C) (Oral)   Resp 16   Ht 5\' 6"  (1.676 m)   Wt 84.5 kg   LMP 02/19/2019   SpO2 99% Comment: room air  BMI 30.07 kg/m  Physical Exam  NAD, well-appearing female   A Non pregnant female Medical screening exam complete @DX @  P Discharge from MAU in stable condition Patient given the option of transfer to [facility] for further evaluation or seek care in outpatient facility of choice List of options for follow-up given  In-basket message sent to [facility]  Warning signs for worsening condition that would warrant emergency follow-up discussed Patient may return to MAU as needed for pregnancy related complaints  , [facility], CNM 03/29/2019 10:48 AM

## 2019-03-31 ENCOUNTER — Telehealth (HOSPITAL_COMMUNITY): Payer: Self-pay

## 2019-03-31 ENCOUNTER — Other Ambulatory Visit: Payer: Self-pay

## 2019-03-31 ENCOUNTER — Emergency Department (HOSPITAL_COMMUNITY)
Admission: EM | Admit: 2019-03-31 | Discharge: 2019-03-31 | Disposition: A | Discharge status: Home / Self Care | Payer: Medicaid Other | Attending: Emergency Medicine | Admitting: Emergency Medicine

## 2019-03-31 ENCOUNTER — Encounter (HOSPITAL_COMMUNITY): Payer: Self-pay | Admitting: Emergency Medicine

## 2019-03-31 DIAGNOSIS — B9689 Other specified bacterial agents as the cause of diseases classified elsewhere: Secondary | ICD-10-CM

## 2019-03-31 DIAGNOSIS — N760 Acute vaginitis: Secondary | ICD-10-CM | POA: Insufficient documentation

## 2019-03-31 DIAGNOSIS — R590 Localized enlarged lymph nodes: Secondary | ICD-10-CM | POA: Insufficient documentation

## 2019-03-31 LAB — WET PREP, GENITAL
Sperm: NONE SEEN
Trich, Wet Prep: NONE SEEN
Yeast Wet Prep HPF POC: NONE SEEN

## 2019-03-31 LAB — HIV ANTIBODY (ROUTINE TESTING W REFLEX): HIV Screen 4th Generation wRfx: NONREACTIVE

## 2019-03-31 LAB — RPR: RPR Ser Ql: NONREACTIVE

## 2019-03-31 MED ORDER — METRONIDAZOLE 500 MG PO TABS: 500.0000 mg | ORAL_TABLET | Freq: Two times a day (BID) | ORAL | 0 refills | Status: DC

## 2019-03-31 NOTE — ED Triage Notes (Signed)
Patient complaining of boils near vagina. Patient husband is here at this visit to because he has a rash all over his penis. Patient thinks it is an STD.

## 2019-03-31 NOTE — ED Notes (Signed)
Labeled urine specimen and culture sent to lab. ENMiles 

## 2019-03-31 NOTE — ED Notes (Signed)
Patient given discharge teaching and verbalized understanding. Patient ambulated out of ED with a steady gait. Patient unable to allow this writer to update VS within 1 hour before discharge. Patient wanted to go home.   Patient stable. Patient ambulated with a steady gate. Patient Alert and Oriented x 4.

## 2019-03-31 NOTE — ED Provider Notes (Signed)
[facility] DEPT Provider Note   CSN: [id number] Arrival date & time: 03/31/19  5035     History Chief Complaint  Patient presents with  . Vaginal Pain    Olivia Lawson is a 34 y.o. female.  Patient presents to the emergency department for evaluation of concern over possible STD.  Patient reports that a couple of weeks ago she saw her doctor because of inguinal lymphadenopathy.  At that time she was noted to have some "ingrown hairs".  Over the last couple of days her husband started to complain of a rash in his groin area and she has become concerned.  She sees an area on the posterior aspect of her vagina that is white and tender to the touch.  She has not had any vaginal discharge but does report a history of vaginal yeast infections.        Past Medical History:  Diagnosis Date  . Migraine   . Placental abruption     Patient Active Problem List   Diagnosis Date Noted  . Vaginal discharge 11/28/2017  . Chronic headaches 11/28/2017  . VBAC (vaginal birth after Cesarean) 08/28/2010    Past Surgical History:  Procedure Laterality Date  . CESAREAN SECTION    . DENTAL SURGERY    . HERNIA REPAIR       OB History    Gravida  4   Para  3   Term  3   Preterm  0   AB  1   Living  3     SAB  1   TAB  0   Ectopic  0   Multiple  0   Live Births  3           History reviewed. No pertinent family history.  Social History   Tobacco Use  . Smoking status: Never Smoker  . Smokeless tobacco: Never Used  Substance Use Topics  . Alcohol use: No  . Drug use: No    Home Medications Prior to Admission medications   Medication Sig Start Date End Date Taking? Authorizing Provider  metroNIDAZOLE (FLAGYL) 500 MG tablet Take 1 tablet (500 mg total) by mouth 2 (two) times daily. One po bid x 7 days 03/31/19   Orpah Greek, MD    Allergies    Monistat [miconazole]  Review of Systems   Review of Systems    Constitutional: Negative for fever.  Genitourinary: Positive for genital sores.    Physical Exam Updated Vital Signs BP (!) 147/93 (BP Location: Left Arm)   Pulse 77   Temp 98.3 F (36.8 C) (Oral)   Resp 18   Ht 5' 6.5" (1.689 m)   Wt 83 kg   LMP 02/19/2019   SpO2 99%   BMI 29.09 kg/m   Physical Exam Vitals and nursing note reviewed.  Constitutional:      General: She is not in acute distress.    Appearance: Normal appearance. She is well-developed.  HENT:     Head: Normocephalic and atraumatic.     Right Ear: Hearing normal.     Left Ear: Hearing normal.     Nose: Nose normal.  Eyes:     Conjunctiva/sclera: Conjunctivae normal.     Pupils: Pupils are equal, round, and reactive to light.  Cardiovascular:     Rate and Rhythm: Regular rhythm.     Heart sounds: S1 normal and S2 normal. No murmur. No friction rub. No gallop.   Pulmonary:  Effort: Pulmonary effort is normal. No respiratory distress.     Breath sounds: Normal breath sounds.  Chest:     Chest wall: No tenderness.  Abdominal:     General: Bowel sounds are normal.     Palpations: Abdomen is soft.     Tenderness: There is no abdominal tenderness. There is no guarding or rebound. Negative signs include Murphy's sign and McBurney's sign.     Hernia: No hernia is present.  Genitourinary:    General: Normal vulva.     Vagina: Normal.     Cervix: Normal.     Uterus: Normal.      Adnexa: Right adnexa normal and left adnexa normal.     Comments: Area at the most posterior aspect of the right labia minora with very slight white discoloration but no discrete lesion, no tenderness.  No other external lesions noted. Musculoskeletal:        General: Normal range of motion.     Cervical back: Normal range of motion and neck supple.  Skin:    General: Skin is warm and dry.     Findings: No rash.  Neurological:     Mental Status: She is alert and oriented to person, place, and time.     GCS: GCS eye subscore is  4. GCS verbal subscore is 5. GCS motor subscore is 6.     Cranial Nerves: No cranial nerve deficit.     Sensory: No sensory deficit.     Coordination: Coordination normal.  Psychiatric:        Speech: Speech normal.        Behavior: Behavior normal.        Thought Content: Thought content normal.     ED Results / Procedures / Treatments   Labs (all labs ordered are listed, but only abnormal results are displayed) Labs Reviewed  WET PREP, GENITAL - Abnormal; Notable for the following components:      Result Value   Clue Cells Wet Prep HPF POC PRESENT (*)    WBC, Wet Prep HPF POC MANY (*)    All other components within normal limits  RPR  HIV ANTIBODY (ROUTINE TESTING W REFLEX)  GC/CHLAMYDIA PROBE AMP ([facility]) NOT AT [facility]    EKG None  Radiology No results found.  Procedures Procedures (including critical care time)  Medications Ordered in ED Medications - No data to display  ED Course  I have reviewed the triage vital signs and the nursing notes.  Pertinent labs & imaging results that were available during my care of the patient were reviewed by me and considered in my medical decision making (see chart for details).    MDM Rules/Calculators/A&P                      No specific external or internal lesions, pelvic exam essentially unremarkable.  Patient very concerned about possible infectious etiology.  HIV, RPR, GC and chlamydia sent to lab.  Expect all will be negative.  No external signs of bacterial infection that requires treatment at this time.  Wet prep consistent with BV, no other findings.  Final Clinical Impression(s) / ED Diagnoses Final diagnoses:  Inguinal lymphadenopathy  BV (bacterial vaginosis)    Rx / DC Orders ED Discharge Orders         Ordered    metroNIDAZOLE (FLAGYL) 500 MG tablet  2 times daily     03/31/19 0421  Gilda Crease, MD 03/31/19 [phone]

## 2019-04-01 LAB — GC/CHLAMYDIA PROBE AMP (~~LOC~~) NOT AT ARMC
Chlamydia: NEGATIVE
Neisseria Gonorrhea: NEGATIVE

## 2019-04-04 ENCOUNTER — Telehealth: Payer: Self-pay | Admitting: Obstetrics and Gynecology

## 2019-04-04 NOTE — Telephone Encounter (Signed)
Patient called to reschedule her appointment because she does not have a way to get here. I offered her the number to call to get a Lyft. However, she stated she would just rather reschedule, and it was ok if it was out awhile. I told her I would place her on the wait list. She stated that was fine.

## 2019-04-05 ENCOUNTER — Other Ambulatory Visit: Payer: Self-pay

## 2019-04-05 ENCOUNTER — Encounter (HOSPITAL_COMMUNITY): Payer: Self-pay | Admitting: Emergency Medicine

## 2019-04-05 ENCOUNTER — Telehealth (HOSPITAL_COMMUNITY): Payer: Self-pay

## 2019-04-05 ENCOUNTER — Encounter: Payer: Self-pay | Admitting: Obstetrics and Gynecology

## 2019-04-05 DIAGNOSIS — Z202 Contact with and (suspected) exposure to infections with a predominantly sexual mode of transmission: Secondary | ICD-10-CM | POA: Insufficient documentation

## 2019-04-05 DIAGNOSIS — N898 Other specified noninflammatory disorders of vagina: Secondary | ICD-10-CM | POA: Insufficient documentation

## 2019-04-05 NOTE — ED Triage Notes (Signed)
Patient reports recently treated for BV. Reports white vaginal discharge intermittently x2 moths.

## 2019-04-06 ENCOUNTER — Emergency Department (HOSPITAL_COMMUNITY)
Admission: EM | Admit: 2019-04-06 | Discharge: 2019-04-06 | Disposition: A | Discharge status: Home / Self Care | Payer: Medicaid Other | Attending: Emergency Medicine | Admitting: Emergency Medicine

## 2019-04-06 DIAGNOSIS — Z03818 Encounter for observation for suspected exposure to other biological agents ruled out: Secondary | ICD-10-CM

## 2019-04-06 NOTE — ED Notes (Signed)
EDP at bedside  

## 2019-04-06 NOTE — ED Notes (Signed)
Pt A/O and ambulatory upon discharge. PT verbalized discharge instructions and how to obtain test results. Pt denies any other questions.  Pt ambulated out of ED with husband.

## 2019-04-06 NOTE — ED Provider Notes (Signed)
[facility] DEPT Provider Note   CSN: [id number] Arrival date & time: 04/05/19  2021     History Chief Complaint  Patient presents with  . Vaginal Discharge    Olivia Lawson is a 34 y.o. female.  34 year old female presents to the emergency department requesting HSV testing.  She reports requesting this test when she was seen 1 week ago for vaginal discharge, but later realized that this blood work was not completed.  States that her husband recently found out he was a carrier for HSV-1.  States her vaginal discharge has been improving since taking Flagyl. She has not had any new vaginal bleeding, discharge, fevers. Denies presence of genital sores or lesions.  Does not have active OBGYN care.   Vaginal Discharge      Past Medical History:  Diagnosis Date  . Migraine   . Placental abruption     Patient Active Problem List   Diagnosis Date Noted  . Vaginal discharge 11/28/2017  . Chronic headaches 11/28/2017  . VBAC (vaginal birth after Cesarean) 08/28/2010    Past Surgical History:  Procedure Laterality Date  . CESAREAN SECTION    . DENTAL SURGERY    . HERNIA REPAIR       OB History    Gravida  4   Para  3   Term  3   Preterm  0   AB  1   Living  3     SAB  1   TAB  0   Ectopic  0   Multiple  0   Live Births  3           No family history on file.  Social History   Tobacco Use  . Smoking status: Never Smoker  . Smokeless tobacco: Never Used  Substance Use Topics  . Alcohol use: No  . Drug use: No    Home Medications Prior to Admission medications   Medication Sig Start Date End Date Taking? Authorizing Provider  metroNIDAZOLE (FLAGYL) 500 MG tablet Take 1 tablet (500 mg total) by mouth 2 (two) times daily. One po bid x 7 days 03/31/19  Yes Pollina, Gwenyth Allegra, MD    Allergies    Monistat [miconazole]  Review of Systems   Review of Systems  Genitourinary: Positive for vaginal discharge.    Ten systems reviewed and are negative for acute change, except as noted in the HPI.    Physical Exam Updated Vital Signs BP 133/81   Pulse 66   Temp 100 F (37.8 C)   Resp 15   LMP 02/19/2019   SpO2 98%   Physical Exam Vitals and nursing note reviewed.  Constitutional:      General: She is not in acute distress.    Appearance: She is well-developed. She is not diaphoretic.     Comments: Nontoxic appearing and in NAD  HENT:     Head: Normocephalic and atraumatic.  Eyes:     General: No scleral icterus.    Conjunctiva/sclera: Conjunctivae normal.  Pulmonary:     Effort: Pulmonary effort is normal. No respiratory distress.     Comments: Respirations even and unlabored Genitourinary:    Comments: Deferred  Musculoskeletal:        General: Normal range of motion.     Cervical back: Normal range of motion.  Skin:    General: Skin is warm and dry.     Coloration: Skin is not pale.     Findings: No  erythema or rash.  Neurological:     General: No focal deficit present.     Mental Status: She is alert and oriented to person, place, and time.     Coordination: Coordination normal.  Psychiatric:        Behavior: Behavior normal.     ED Results / Procedures / Treatments   Labs (all labs ordered are listed, but only abnormal results are displayed) Labs Reviewed  HSV 1 ANTIBODY, IGG  HSV 2 ANTIBODY, IGG    EKG None  Radiology No results found.  Procedures Procedures (including critical care time)  Medications Ordered in ED Medications - No data to display  ED Course  I have reviewed the triage vital signs and the nursing notes.  Pertinent labs & imaging results that were available during my care of the patient were reviewed by me and considered in my medical decision making (see chart for details).    MDM Rules/Calculators/A&P                      34 year old female presents to the emergency department requesting testing for HSV-1 and HSV-2.  States that  her husband recently found out he was a carrier for HSV-1.  She was seen 1 week ago for vaginal discharge.  This has been improving since taking Flagyl.  Denies any new genital sores or lesions, vaginal pain.  Explained that the only indication for herpes treatment would be in the setting of an active outbreak.  Routine blood work was sent.  Patient able to follow-up on this through MyChart.  She will be referred to [facility] outpatient clinic for ongoing, routine GYN care.  Return precautions discussed and provided. Patient discharged in stable condition with no unaddressed concerns.   Final Clinical Impression(s) / ED Diagnoses Final diagnoses:  Encounter for patient concern about exposure to infectious organism    Rx / DC Orders ED Discharge Orders    None       Antony Madura, PA-C 04/06/19 0203    Molpus, Jonny Ruiz, MD 04/06/19 [phone]

## 2019-04-06 NOTE — Discharge Instructions (Addendum)
Follow-up on the results of your blood tests in MyChart.  Upon review of your prior work-ups, you did test negative for herpes simplex virus in 2017.  We recommend ongoing follow-up with the women's outpatient clinic for routine GYN care.  You may return to the ED for any new or concerning symptoms.

## 2019-04-07 LAB — HSV 1 ANTIBODY, IGG: HSV 1 Glycoprotein G Ab, IgG: 1.64 index — ABNORMAL HIGH (ref 0.00–0.90)

## 2019-04-07 LAB — HSV 2 ANTIBODY, IGG: HSV 2 Glycoprotein G Ab, IgG: <0.91 index (ref 0.00–0.90)

## 2019-05-01 ENCOUNTER — Ambulatory Visit: Payer: Self-pay | Attending: Family Medicine | Admitting: Family Medicine

## 2019-05-01 ENCOUNTER — Other Ambulatory Visit: Payer: Self-pay

## 2019-05-01 DIAGNOSIS — B9689 Other specified bacterial agents as the cause of diseases classified elsewhere: Secondary | ICD-10-CM

## 2019-05-01 DIAGNOSIS — G4489 Other headache syndrome: Secondary | ICD-10-CM

## 2019-05-01 DIAGNOSIS — N760 Acute vaginitis: Secondary | ICD-10-CM

## 2019-05-01 MED ORDER — TOPIRAMATE 50 MG PO TABS: 50.0000 mg | ORAL_TABLET | Freq: Two times a day (BID) | ORAL | 1 refills | Status: DC

## 2019-05-01 MED ORDER — METRONIDAZOLE 500 MG PO TABS: 500.0000 mg | ORAL_TABLET | Freq: Two times a day (BID) | ORAL | 0 refills | Status: DC

## 2019-05-01 MED ORDER — FLUCONAZOLE 150 MG PO TABS: 150.0000 mg | ORAL_TABLET | Freq: Once | ORAL | 0 refills | Status: AC

## 2019-05-01 MED FILL — FLUCONAZOLE 150 MG TABLET: 150 | 1 days supply | Qty: 1 | Fill #0

## 2019-05-01 MED FILL — TOPIRAMATE 50 MG TABLET: 50 | 30 days supply | Qty: 60 | Fill #0

## 2019-05-01 MED FILL — metroNIDAZOLE 500 MG TABS: 500 | 7 days supply | Qty: 14 | Fill #0

## 2019-05-01 NOTE — Progress Notes (Signed)
Virtual Visit via Video Note  I connected with Olivia Lawson, on 05/01/2019 at 1:56 PM by video enabled telemedicine device due to the COVID-19 pandemic and verified that I am speaking with the correct person using two identifiers.   Consent: I discussed the limitations, risks, security and privacy concerns of performing an evaluation and management service by telemedicine and the availability of in person appointments. I also discussed with the patient that there may be a patient responsible charge related to this service. The patient expressed understanding and agreed to proceed.   Location of Patient: Outside facility  Location of Provider: Clinic   Persons participating in Telemedicine visit: Eartha S Raynah Gomes [place]-CMA Dr. Margarita Rana     History of Present Illness: 34 year old female who presents today to establish care.   A month and a half ago she started having recurrent BV after using a different kind of soap for which she had an ED visit and was treated with metronidazole for 7 days but she informs me she thought the duration of treatment should have been 14 days. Never completely cleared BV infection after her ED visit; she does not douche. Using boric acid with no much improvement. She has had the same sexual partner-her husband.  She has had migraines in the past. Last she had a headache 7 days ago with associated cyst photophobia which was very severe and on subsequent days after she has had a chronic headache which has not been as intense.  Currently uses ibuprofen or Tylenol for relief. Past Medical History:  Diagnosis Date  . Migraine   . Placental abruption    Allergies  Allergen Reactions  . Monistat [Miconazole] Swelling and Rash    Ampule, 1 day insert.     Current Outpatient Medications on File Prior to Visit  Medication Sig Dispense Refill  . metroNIDAZOLE (FLAGYL) 500 MG tablet Take 1 tablet (500 mg total) by mouth 2 (two) times daily. One po  bid x 7 days (Patient not taking: Reported on 05/01/2019) 14 tablet 0   No current facility-administered medications on file prior to visit.    Observations/Objective: Awake, alert, oriented x3 Not in acute distress  Assessment and Plan: 1. Other headache syndrome Commenced on Topamax for Prophylaxis Discussed teratogenic and weight loss side effects and the need for contraception; she states she has no intention of getting pregnant. Reassess at next visit for improvement - topiramate (TOPAMAX) 50 MG tablet; Take 1 tablet (50 mg total) by mouth 2 (two) times daily. Start with half tab bid x1 week then increase to 1 tab bid  Dispense: 60 tablet; Refill: 1  2. Bacterial vaginosis We will treat again with another round of Flagyl If symptoms recur (3 episodes in 1 year) we will consider prophylactic treatment with MetroGel twice a week for the next 6 months. - metroNIDAZOLE (FLAGYL) 500 MG tablet; Take 1 tablet (500 mg total) by mouth 2 (two) times daily.  Dispense: 14 tablet; Refill: 0 - fluconazole (DIFLUCAN) 150 MG tablet; Take 1 tablet (150 mg total) by mouth once for 1 dose.  Dispense: 1 tablet; Refill: 0   Follow Up Instructions: 2 months   I discussed the assessment and treatment plan with the patient. The patient was provided an opportunity to ask questions and all were answered. The patient agreed with the plan and demonstrated an understanding of the instructions.   The patient was advised to call back or seek an in-person evaluation if the symptoms worsen or if the  condition fails to improve as anticipated.     I provided 17 minutes total of Telehealth time during this encounter including median intraservice time, reviewing previous notes, investigations, ordering medications, medical decision making, coordinating care and patient verbalized understanding at the end of the visit.     Hoy Register, MD, FAAFP. [facility] [place],  [place] [phone]   05/01/2019, 1:56 PM

## 2019-05-01 NOTE — Progress Notes (Signed)
Past history of BV was seen 1 month ago for BV has taken Flagyl she took medication for 7 days she then got another 7 days worth and it cleared. She has still had BV symptoms and would like another refill on flagyl. She has been using boric acid in the mean time.  Has been having headaches.

## 2019-05-12 MED FILL — FLUCONAZOLE 150 MG TABLET: 150 | 1 days supply | Qty: 1 | Fill #0

## 2019-05-12 MED FILL — metroNIDAZOLE 500 MG TABS: 500 | 7 days supply | Qty: 14 | Fill #0

## 2019-05-12 MED FILL — TOPIRAMATE 50 MG TABLET: 50 | 30 days supply | Qty: 60 | Fill #0

## 2019-06-26 ENCOUNTER — Encounter: Payer: Self-pay | Admitting: Family Medicine

## 2019-06-26 ENCOUNTER — Other Ambulatory Visit: Payer: Self-pay | Admitting: Family Medicine

## 2019-06-26 DIAGNOSIS — Z13228 Encounter for screening for other metabolic disorders: Secondary | ICD-10-CM

## 2019-06-26 MED ORDER — FLUCONAZOLE 150 MG PO TABS: 150.0000 mg | ORAL_TABLET | Freq: Once | ORAL | 1 refills | Status: AC

## 2019-08-03 ENCOUNTER — Ambulatory Visit: Payer: Self-pay | Attending: Family Medicine

## 2019-08-03 ENCOUNTER — Other Ambulatory Visit: Payer: Self-pay | Admitting: Family Medicine

## 2019-08-03 ENCOUNTER — Other Ambulatory Visit: Payer: Self-pay

## 2019-08-03 DIAGNOSIS — Z13228 Encounter for screening for other metabolic disorders: Secondary | ICD-10-CM

## 2019-08-04 ENCOUNTER — Encounter: Payer: Self-pay | Admitting: Family Medicine

## 2019-08-04 ENCOUNTER — Other Ambulatory Visit: Payer: Self-pay | Admitting: Family Medicine

## 2019-08-04 DIAGNOSIS — R7989 Other specified abnormal findings of blood chemistry: Secondary | ICD-10-CM

## 2019-08-04 LAB — CBC WITH DIFFERENTIAL/PLATELET
Basophils Absolute: 0 10*3/uL (ref 0.0–0.2)
Basos: 1 %
EOS (ABSOLUTE): 0.1 10*3/uL (ref 0.0–0.4)
Eos: 4 %
Hematocrit: 42.9 % (ref 34.0–46.6)
Hemoglobin: 14 g/dL (ref 11.1–15.9)
Immature Grans (Abs): 0 10*3/uL (ref 0.0–0.1)
Immature Granulocytes: 0 %
Lymphocytes Absolute: 1.7 10*3/uL (ref 0.7–3.1)
Lymphs: 42 %
MCH: 30.1 pg (ref 26.6–33.0)
MCHC: 32.6 g/dL (ref 31.5–35.7)
MCV: 92 fL (ref 79–97)
Monocytes Absolute: 0.5 10*3/uL (ref 0.1–0.9)
Monocytes: 13 %
Neutrophils Absolute: 1.6 10*3/uL (ref 1.4–7.0)
Neutrophils: 40 %
Platelets: 255 10*3/uL (ref 150–450)
RBC: 4.65 x10E6/uL (ref 3.77–5.28)
RDW: 12.6 % (ref 11.7–15.4)
WBC: 4 10*3/uL (ref 3.4–10.8)

## 2019-08-04 LAB — LIPID PANEL
Chol/HDL Ratio: 2.4 ratio (ref 0.0–4.4)
Cholesterol, Total: 180 mg/dL (ref 100–199)
HDL: 75 mg/dL (ref >39)
LDL Chol Calc (NIH): 96 mg/dL (ref 0–99)
Triglycerides: 41 mg/dL (ref 0–149)
VLDL Cholesterol Cal: 9 mg/dL (ref 5–40)

## 2019-08-04 LAB — CMP14+EGFR
ALT: 34 IU/L — ABNORMAL HIGH (ref 0–32)
AST: 71 IU/L — ABNORMAL HIGH (ref 0–40)
Albumin/Globulin Ratio: 1.7 (ref 1.2–2.2)
Albumin: 4.3 g/dL (ref 3.8–4.8)
Alkaline Phosphatase: 49 IU/L (ref 48–121)
BUN/Creatinine Ratio: 15 (ref 9–23)
BUN: 12 mg/dL (ref 6–20)
Bilirubin Total: 0.7 mg/dL (ref 0.0–1.2)
CO2: 20 mmol/L (ref 20–29)
Calcium: 9.4 mg/dL (ref 8.7–10.2)
Chloride: 104 mmol/L (ref 96–106)
Creatinine, Ser: 0.8 mg/dL (ref 0.57–1.00)
GFR calc Af Amer: 111 mL/min/{1.73_m2} (ref >59)
GFR calc non Af Amer: 96 mL/min/{1.73_m2} (ref >59)
Globulin, Total: 2.6 g/dL (ref 1.5–4.5)
Glucose: 73 mg/dL (ref 65–99)
Potassium: 4.1 mmol/L (ref 3.5–5.2)
Sodium: 140 mmol/L (ref 134–144)
Total Protein: 6.9 g/dL (ref 6.0–8.5)

## 2019-08-04 LAB — TSH: TSH: 1.51 u[IU]/mL (ref 0.450–4.500)

## 2019-08-04 LAB — HEMOGLOBIN A1C
Est. average glucose Bld gHb Est-mCnc: 100 mg/dL
Hgb A1c MFr Bld: 5.1 % (ref 4.8–5.6)

## 2019-08-04 LAB — T4, FREE: Free T4: 1.12 ng/dL (ref 0.82–1.77)

## 2019-08-04 LAB — VITAMIN D 25 HYDROXY (VIT D DEFICIENCY, FRACTURES): Vit D, 25-Hydroxy: 39 ng/mL (ref 30.0–100.0)

## 2019-08-17 ENCOUNTER — Ambulatory Visit: Payer: Self-pay | Attending: Family Medicine

## 2019-08-17 ENCOUNTER — Other Ambulatory Visit: Payer: Self-pay

## 2019-08-17 DIAGNOSIS — R7989 Other specified abnormal findings of blood chemistry: Secondary | ICD-10-CM

## 2019-08-18 ENCOUNTER — Encounter: Payer: Self-pay | Admitting: Family Medicine

## 2019-08-18 ENCOUNTER — Other Ambulatory Visit: Payer: Medicaid Other

## 2019-08-18 LAB — HEPATITIS PANEL, ACUTE
Hep A IgM: NEGATIVE
Hep B C IgM: NEGATIVE
Hep C Virus Ab: <0.1 s/co ratio (ref 0.0–0.9)
Hepatitis B Surface Ag: NEGATIVE

## 2019-08-18 LAB — AFP TUMOR MARKER: AFP, Serum, Tumor Marker: 1.9 ng/mL (ref 0.0–8.3)

## 2019-08-18 LAB — GAMMA GT: GGT: 16 IU/L (ref 0–60)

## 2019-09-14 ENCOUNTER — Other Ambulatory Visit: Payer: Self-pay

## 2019-09-20 ENCOUNTER — Encounter: Payer: Medicaid Other | Admitting: Family Medicine

## 2019-10-24 ENCOUNTER — Other Ambulatory Visit: Payer: Self-pay

## 2019-10-24 ENCOUNTER — Encounter: Payer: Self-pay | Admitting: Family Medicine

## 2019-10-24 ENCOUNTER — Ambulatory Visit: Payer: Medicaid Other | Attending: Family Medicine | Admitting: Family Medicine

## 2019-10-24 VITALS — BP 107/62 | HR 64 | Ht 66.0 in | Wt 178.0 lb

## 2019-10-24 DIAGNOSIS — R748 Abnormal levels of other serum enzymes: Secondary | ICD-10-CM | POA: Insufficient documentation

## 2019-10-24 DIAGNOSIS — Z0000 Encounter for general adult medical examination without abnormal findings: Secondary | ICD-10-CM

## 2019-10-24 DIAGNOSIS — M2559 Pain in other specified joint: Secondary | ICD-10-CM

## 2019-10-24 DIAGNOSIS — Z113 Encounter for screening for infections with a predominantly sexual mode of transmission: Secondary | ICD-10-CM | POA: Insufficient documentation

## 2019-10-24 DIAGNOSIS — Z0001 Encounter for general adult medical examination with abnormal findings: Secondary | ICD-10-CM | POA: Insufficient documentation

## 2019-10-24 DIAGNOSIS — Z7182 Exercise counseling: Secondary | ICD-10-CM | POA: Insufficient documentation

## 2019-10-24 DIAGNOSIS — Z79899 Other long term (current) drug therapy: Secondary | ICD-10-CM | POA: Insufficient documentation

## 2019-10-24 DIAGNOSIS — Z124 Encounter for screening for malignant neoplasm of cervix: Secondary | ICD-10-CM | POA: Insufficient documentation

## 2019-10-24 DIAGNOSIS — R7989 Other specified abnormal findings of blood chemistry: Secondary | ICD-10-CM | POA: Insufficient documentation

## 2019-10-24 NOTE — Progress Notes (Signed)
Subjective:  Patient ID: Olivia Lawson, female    DOB: 04-Apr-1985  Age: 34 y.o. MRN: [id number]  CC: Annual Exam and Gynecologic Exam   HPI [name] presents for an annual physical exam She is due for Pap smear.  Denies family history of cancer. She is requesting rheumatologic labs given a positive family history of rheumatoid and the fact that she has had intermittent joint pains in the past. Last set of labs had revealed slightly elevated liver enzymes.  Past Medical History:  Diagnosis Date  . Migraine   . Placental abruption     Past Surgical History:  Procedure Laterality Date  . CESAREAN SECTION    . DENTAL SURGERY    . HERNIA REPAIR      History reviewed. No pertinent family history.  Allergies  Allergen Reactions  . Monistat [Miconazole] Swelling and Rash    Ampule, 1 day insert.     Outpatient Medications Prior to Visit  Medication Sig Dispense Refill  . metroNIDAZOLE (FLAGYL) 500 MG tablet Take 1 tablet (500 mg total) by mouth 2 (two) times daily. 14 tablet 0  . topiramate (TOPAMAX) 50 MG tablet Take 1 tablet (50 mg total) by mouth 2 (two) times daily. Start with half tab bid x1 week then increase to 1 tab bid 60 tablet 1   No facility-administered medications prior to visit.     ROS Review of Systems  Constitutional: Negative for activity change, appetite change and fatigue.  HENT: Negative for congestion, sinus pressure and sore throat.   Eyes: Negative for visual disturbance.  Respiratory: Negative for cough, chest tightness, shortness of breath and wheezing.   Cardiovascular: Negative for chest pain and palpitations.  Gastrointestinal: Negative for abdominal distention, abdominal pain and constipation.  Endocrine: Negative for polydipsia.  Genitourinary: Negative.  Negative for dysuria and frequency.  Musculoskeletal: Negative.  Negative for arthralgias and back pain.  Skin: Negative for rash.  Neurological: Negative for tremors,  light-headedness and numbness.  Hematological: Does not bruise/bleed easily.  Psychiatric/Behavioral: Negative for agitation, behavioral problems and dysphoric mood.    Objective:  BP 107/62   Pulse 64   Ht 5' 6"  (1.676 m)   Wt 178 lb (80.7 kg)   SpO2 99%   BMI 28.73 kg/m   BP/Weight 10/24/2019 04/06/2019 7/67/3419  Systolic BP 379 024 097  Diastolic BP 62 78 93  Wt. (Lbs) 178 - 183  BMI 28.73 - 29.09      Physical Exam Exam conducted with a chaperone present.  Constitutional:      Appearance: She is well-developed.  Neck:     Vascular: No JVD.  Cardiovascular:     Rate and Rhythm: Normal rate.     Heart sounds: Normal heart sounds. No murmur heard.   Pulmonary:     Effort: Pulmonary effort is normal.     Breath sounds: Normal breath sounds. No wheezing or rales.  Chest:     Chest wall: No tenderness.  Abdominal:     General: Bowel sounds are normal. There is no distension.     Palpations: Abdomen is soft. There is no mass.     Tenderness: There is no abdominal tenderness.  Genitourinary:    Comments: External genitalia, vagina, normal Brownish blood in cervical os Musculoskeletal:        General: Normal range of motion.     Right lower leg: No edema.     Left lower leg: No edema.  Neurological:  Mental Status: She is alert and oriented to person, place, and time.  Psychiatric:        Mood and Affect: Mood normal.     CMP Latest Ref Rng & Units 08/03/2019 12/20/2013 05/11/2013  Glucose 65 - 99 mg/dL 73 86 102(H)  BUN 6 - 20 mg/dL 12 23 16   Creatinine 0.57 - 1.00 mg/dL 0.80 0.72 0.80  Sodium 134 - 144 mmol/L 140 138 140  Potassium 3.5 - 5.2 mmol/L 4.1 4.4 4.0  Chloride 96 - 106 mmol/L 104 102 104  CO2 20 - 29 mmol/L 20 25 -  Calcium 8.7 - 10.2 mg/dL 9.4 9.3 -  Total Protein 6.0 - 8.5 g/dL 6.9 6.8 -  Total Bilirubin 0.0 - 1.2 mg/dL 0.7 0.2(L) -  Alkaline Phos 48 - 121 IU/L 49 57 -  AST 0 - 40 IU/L 71(H) 21 -  ALT 0 - 32 IU/L 34(H) 19 -    Lipid  Panel     Component Value Date/Time   CHOL 180 08/03/2019 0900   TRIG 41 08/03/2019 0900   HDL 75 08/03/2019 0900   CHOLHDL 2.4 08/03/2019 0900   LDLCALC 96 08/03/2019 0900    CBC    Component Value Date/Time   WBC 4.0 08/03/2019 0900   WBC 5.6 04/16/2014 2250   RBC 4.65 08/03/2019 0900   RBC 4.43 04/16/2014 2250   HGB 14.0 08/03/2019 0900   HCT 42.9 08/03/2019 0900   PLT 255 08/03/2019 0900   MCV 92 08/03/2019 0900   MCH 30.1 08/03/2019 0900   MCH 30.0 04/16/2014 2250   MCHC 32.6 08/03/2019 0900   MCHC 34.3 04/16/2014 2250   RDW 12.6 08/03/2019 0900   LYMPHSABS 1.7 08/03/2019 0900   MONOABS 0.6 04/16/2014 2250   EOSABS 0.1 08/03/2019 0900   BASOSABS 0.0 08/03/2019 0900    Lab Results  Component Value Date   HGBA1C 5.1 08/03/2019    Assessment & Plan:   1. Annual physical exam Counseled on 150 minutes of exercise per week, healthy eating (including decreased daily intake of saturated fats, cholesterol, added sugars, sodium), routine healthcare maintenance.   2. Elevated LFTs - CMP14+EGFR  3. Screening for cervical cancer - Cytology - PAP([place])  4. Pain in other joint - RA Qn+CCP(IgG/A)+SjoSSA+SjoSSB - ANA,IFA RA Diag Pnl w/rflx Tit/Patn - Anti-DNA antibody, double-stranded - Sedimentation Rate - C-reactive protein  5. Screening for STD (sexually transmitted disease) - Cervicovaginal ancillary only  Health Care Maintenance: Declines flu shot No orders of the defined types were placed in this encounter.   Follow-up: Return in about 1 year (around 10/23/2020) for Annual physical exam.       Charlott Rakes, MD, FAAFP. [facility] [place], [place]   10/24/2019, 12:29 PM

## 2019-10-24 NOTE — Patient Instructions (Signed)

## 2019-10-25 LAB — CERVICOVAGINAL ANCILLARY ONLY
Bacterial Vaginitis (gardnerella): NEGATIVE
Candida Glabrata: NEGATIVE
Candida Vaginitis: NEGATIVE
Chlamydia: NEGATIVE
Comment: NEGATIVE
Comment: NEGATIVE
Comment: NEGATIVE
Comment: NEGATIVE
Comment: NEGATIVE
Comment: NORMAL
Neisseria Gonorrhea: NEGATIVE
Trichomonas: NEGATIVE

## 2019-10-25 LAB — CYTOLOGY - PAP
Comment: NEGATIVE
Diagnosis: NEGATIVE
High risk HPV: NEGATIVE

## 2019-10-26 LAB — CMP14+EGFR
ALT: 12 IU/L (ref 0–32)
AST: 20 IU/L (ref 0–40)
Albumin/Globulin Ratio: 1.5 (ref 1.2–2.2)
Albumin: 4.1 g/dL (ref 3.8–4.8)
Alkaline Phosphatase: 54 IU/L (ref 44–121)
BUN/Creatinine Ratio: 19 (ref 9–23)
BUN: 17 mg/dL (ref 6–20)
Bilirubin Total: 0.4 mg/dL (ref 0.0–1.2)
CO2: 23 mmol/L (ref 20–29)
Calcium: 9.4 mg/dL (ref 8.7–10.2)
Chloride: 103 mmol/L (ref 96–106)
Creatinine, Ser: 0.91 mg/dL (ref 0.57–1.00)
GFR calc Af Amer: 95 mL/min/{1.73_m2} (ref >59)
GFR calc non Af Amer: 82 mL/min/{1.73_m2} (ref >59)
Globulin, Total: 2.8 g/dL (ref 1.5–4.5)
Glucose: 76 mg/dL (ref 65–99)
Potassium: 3.9 mmol/L (ref 3.5–5.2)
Sodium: 139 mmol/L (ref 134–144)
Total Protein: 6.9 g/dL (ref 6.0–8.5)

## 2019-10-26 LAB — RA QN+CCP(IGG/A)+SJOSSA+SJOSSB
Cyclic Citrullin Peptide Ab: 6 units (ref 0–19)
ENA SSA (RO) Ab: <0.2 AI (ref 0.0–0.9)
ENA SSB (LA) Ab: <0.2 AI (ref 0.0–0.9)
Rheumatoid fact SerPl-aCnc: <10 IU/mL (ref 0.0–13.9)

## 2019-10-26 LAB — ANA,IFA RA DIAG PNL W/RFLX TIT/PATN: ANA Titer 1: NEGATIVE

## 2019-10-26 LAB — C-REACTIVE PROTEIN: CRP: 1 mg/L (ref 0–10)

## 2019-10-26 LAB — ANTI-DNA ANTIBODY, DOUBLE-STRANDED: dsDNA Ab: 1 IU/mL (ref 0–9)

## 2019-10-26 LAB — SEDIMENTATION RATE: Sed Rate: 6 mm/hr (ref 0–32)

## 2019-10-28 ENCOUNTER — Encounter: Payer: Self-pay | Admitting: Family Medicine

## 2019-11-02 ENCOUNTER — Ambulatory Visit: Payer: Self-pay | Attending: Family Medicine

## 2019-11-02 ENCOUNTER — Other Ambulatory Visit: Payer: Self-pay

## 2019-11-02 DIAGNOSIS — N898 Other specified noninflammatory disorders of vagina: Secondary | ICD-10-CM

## 2019-11-06 LAB — CERVICOVAGINAL ANCILLARY ONLY
Bacterial Vaginitis (gardnerella): NEGATIVE
Candida Glabrata: NEGATIVE
Candida Vaginitis: NEGATIVE
Chlamydia: NEGATIVE
Comment: NEGATIVE
Comment: NEGATIVE
Comment: NEGATIVE
Comment: NEGATIVE
Comment: NEGATIVE
Comment: NORMAL
Neisseria Gonorrhea: NEGATIVE
Trichomonas: NEGATIVE

## 2020-03-17 ENCOUNTER — Encounter: Payer: Self-pay | Admitting: Family Medicine

## 2020-03-18 ENCOUNTER — Other Ambulatory Visit: Payer: Self-pay | Admitting: Family Medicine

## 2020-03-18 MED ORDER — METRONIDAZOLE 500 MG PO TABS: 500.0000 mg | ORAL_TABLET | Freq: Two times a day (BID) | ORAL | 0 refills | Status: DC

## 2020-03-18 MED ORDER — METRONIDAZOLE 0.75 % VA GEL: 1.0000 | Freq: Every day | VAGINAL | 6 refills | Status: DC

## 2020-03-21 ENCOUNTER — Other Ambulatory Visit: Payer: Self-pay | Admitting: Family Medicine

## 2020-03-21 MED ORDER — METRONIDAZOLE 0.75 % VA GEL: 1.0000 | Freq: Every day | VAGINAL | 6 refills | Status: DC

## 2020-04-01 ENCOUNTER — Other Ambulatory Visit: Payer: Self-pay | Admitting: Family Medicine

## 2020-04-01 MED ORDER — FLUCONAZOLE 150 MG PO TABS: 150.0000 mg | ORAL_TABLET | Freq: Once | ORAL | 0 refills | Status: AC

## 2021-04-16 ENCOUNTER — Ambulatory Visit: Payer: Self-pay | Admitting: Allergy

## 2021-06-10 NOTE — Progress Notes (Unsigned)
New Patient Note  RE: Olivia Lawson MRN: [id number] DOB: 1985/06/07 Date of Office Visit: 06/11/2021  Consult requested by: Hoy Register, MD Primary care provider: Hoy Register, MD  Chief Complaint: No chief complaint on file.  History of Present Illness: I had the pleasure of seeing Olivia Lawson for initial evaluation at the [facility] on 06/10/2021. She is a 36 y.o. female, who is referred here by Hoy Register, MD for the evaluation of ***.  ***  Assessment and Plan: Olivia Lawson is a 36 y.o. female with: No problem-specific Assessment & Plan notes found for this encounter.  No follow-ups on file.  No orders of the defined types were placed in this encounter.  Lab Orders  No laboratory test(s) ordered today    Other allergy screening: Asthma: {Blank single:19197::"yes","no"} Rhino conjunctivitis: {Blank single:19197::"yes","no"} Food allergy: {Blank single:19197::"yes","no"} Medication allergy: {Blank single:19197::"yes","no"} Hymenoptera allergy: {Blank single:19197::"yes","no"} Urticaria: {Blank single:19197::"yes","no"} Eczema:{Blank single:19197::"yes","no"} History of recurrent infections suggestive of immunodeficency: {Blank single:19197::"yes","no"}  Diagnostics: Spirometry:  Tracings reviewed. Her effort: {Blank single:19197::"Good reproducible efforts.","It was hard to get consistent efforts and there is a question as to whether this reflects a maximal maneuver.","Poor effort, data can not be interpreted."} FVC: ***L FEV1: ***L, ***% predicted FEV1/FVC ratio: ***% Interpretation: {Blank single:19197::"Spirometry consistent with mild obstructive disease","Spirometry consistent with moderate obstructive disease","Spirometry consistent with severe obstructive disease","Spirometry consistent with possible restrictive disease","Spirometry consistent with mixed obstructive and restrictive disease","Spirometry uninterpretable due to  technique","Spirometry consistent with normal pattern","No overt abnormalities noted given today's efforts"}.  Please see scanned spirometry results for details.  Skin Testing: {Blank single:19197::"Select foods","Environmental allergy panel","Environmental allergy panel and select foods","Food allergy panel","None","Deferred due to recent antihistamines use"}. *** Results discussed with patient/family.   Past Medical History: Patient Active Problem List   Diagnosis Date Noted  . Vaginal discharge 11/28/2017  . Chronic headaches 11/28/2017  . VBAC (vaginal birth after Cesarean) 08/28/2010   Past Medical History:  Diagnosis Date  . Migraine   . Placental abruption    Past Surgical History: Past Surgical History:  Procedure Laterality Date  . CESAREAN SECTION    . DENTAL SURGERY    . HERNIA REPAIR     Medication List:  Current Outpatient Medications  Medication Sig Dispense Refill  . metroNIDAZOLE (FLAGYL) 500 MG tablet Take 1 tablet (500 mg total) by mouth 2 (two) times daily. 14 tablet 0  . metroNIDAZOLE (METROGEL VAGINAL) 0.75 % vaginal gel Place 1 Applicatorful vaginally at bedtime. Twice a week for 6 months for suppressive therapy. 70 g 6   No current facility-administered medications for this visit.   Allergies: Allergies  Allergen Reactions  . Monistat [Miconazole] Swelling and Rash    Ampule, 1 day insert.    Social History: Social History   Socioeconomic History  . Marital status: Married    Spouse name: Not on file  . Number of children: Not on file  . Years of education: Not on file  . Highest education level: Not on file  Occupational History  . Not on file  Tobacco Use  . Smoking status: Never  . Smokeless tobacco: Never  Substance and Sexual Activity  . Alcohol use: No  . Drug use: No  . Sexual activity: Yes    Birth control/protection: None  Other Topics Concern  . Not on file  Social History Narrative  . Not on file   Social Determinants  of Health   Financial Resource Strain: Not on file  Food Insecurity: Not on  file  Transportation Needs: Not on file  Physical Activity: Not on file  Stress: Not on file  Social Connections: Not on file   Lives in a ***. Smoking: *** Occupation: ***  Environmental HistorySurveyor, minerals in the house: Copywriter, advertising in the family room: {Blank single:19197::"yes","no"} Carpet in the bedroom: {Blank single:19197::"yes","no"} Heating: {Blank single:19197::"electric","gas","heat pump"} Cooling: {Blank single:19197::"central","window","heat pump"} Pet: {Blank single:19197::"yes ***","no"}  Family History: No family history on file. Problem                               Relation Asthma                                   *** Eczema                                *** Food allergy                          *** Allergic rhino conjunctivitis     ***  Review of Systems  Constitutional:  Negative for appetite change, chills, fever and unexpected weight change.  HENT:  Negative for congestion and rhinorrhea.   Eyes:  Negative for itching.  Respiratory:  Negative for cough, chest tightness, shortness of breath and wheezing.   Cardiovascular:  Negative for chest pain.  Gastrointestinal:  Negative for abdominal pain.  Genitourinary:  Negative for difficulty urinating.  Skin:  Negative for rash.  Neurological:  Negative for headaches.   Objective: There were no vitals taken for this visit. There is no height or weight on file to calculate BMI. Physical Exam Vitals and nursing note reviewed.  Constitutional:      Appearance: Normal appearance. She is well-developed.  HENT:     Head: Normocephalic and atraumatic.     Right Ear: Tympanic membrane and external ear normal.     Left Ear: Tympanic membrane and external ear normal.     Nose: Nose normal.     Mouth/Throat:     Mouth: Mucous membranes are moist.     Pharynx: Oropharynx is clear.  Eyes:      Conjunctiva/sclera: Conjunctivae normal.  Cardiovascular:     Rate and Rhythm: Normal rate and regular rhythm.     Heart sounds: Normal heart sounds. No murmur heard.   No friction rub. No gallop.  Pulmonary:     Effort: Pulmonary effort is normal.     Breath sounds: Normal breath sounds. No wheezing, rhonchi or rales.  Musculoskeletal:     Cervical back: Neck supple.  Skin:    General: Skin is warm.     Findings: No rash.  Neurological:     Mental Status: She is alert and oriented to person, place, and time.  Psychiatric:        Behavior: Behavior normal.  The plan was reviewed with the patient/family, and all questions/concerned were addressed.  It was my pleasure to see Olivia Lawson today and participate in her care. Please feel free to contact me with any questions or concerns.  Sincerely,  Wyline Mood, DO Allergy & Immunology  [facility] office: [phone] [facility] office: [phone]

## 2021-06-11 ENCOUNTER — Ambulatory Visit: Payer: 59 | Admitting: Allergy

## 2021-06-11 ENCOUNTER — Encounter: Payer: Self-pay | Admitting: Allergy

## 2021-06-11 VITALS — BP 116/74 | HR 89 | Temp 99.2°F | Resp 18 | Ht 66.0 in | Wt 200.1 lb

## 2021-06-11 DIAGNOSIS — H1013 Acute atopic conjunctivitis, bilateral: Secondary | ICD-10-CM | POA: Diagnosis not present

## 2021-06-11 DIAGNOSIS — T781XXA Other adverse food reactions, not elsewhere classified, initial encounter: Secondary | ICD-10-CM

## 2021-06-11 DIAGNOSIS — J3089 Other allergic rhinitis: Secondary | ICD-10-CM | POA: Diagnosis not present

## 2021-06-11 DIAGNOSIS — T781XXD Other adverse food reactions, not elsewhere classified, subsequent encounter: Secondary | ICD-10-CM

## 2021-06-11 DIAGNOSIS — R21 Rash and other nonspecific skin eruption: Secondary | ICD-10-CM

## 2021-06-11 DIAGNOSIS — T50905D Adverse effect of unspecified drugs, medicaments and biological substances, subsequent encounter: Secondary | ICD-10-CM

## 2021-06-11 MED ORDER — MONTELUKAST SODIUM 10 MG PO TABS: 10.0000 mg | ORAL_TABLET | Freq: Every day | ORAL | 5 refills | Status: DC

## 2021-06-11 MED ORDER — RYALTRIS 665-25 MCG/ACT NA SUSP: 1.0000 | Freq: Two times a day (BID) | NASAL | 2 refills | Status: DC

## 2021-06-11 NOTE — Patient Instructions (Addendum)
Today's skin testing showed: Positive to grass, weed pollen, trees, dust mites, mold, cat, dog, cockroach, mouse.  Borderline to horse. Borderline to shellfish.  Negative to other select foods.  Results given.  Vaccine Continue to avoid flu vaccine. The Covid-19 vaccines that are recommended these days are MRNA based vaccines which are different than how the flu vaccine is made. If interested we can do skin testing to the flu vaccine in the future and if negative give the flu injection in a graded vaccine challenge in the office. You must be off antihistamines for 3-5 days before. Must be in good health and not ill. No vaccines/injections/antibiotics within the past 7 days. Plan on being in the office for 2-3 hours. You must call to schedule an appointment and specify it's for a drug testing/challenge.  If interested we can also do the skin testing to the Covid-19 vaccines.   Get bloodwork We are ordering labs, so please allow 1-2 weeks for the results to come back. With the newly implemented Cures Act, the labs might be visible to you at the same time that they become visible to me. However, I will not address the results until all of the results are back, so please be patient.  In the meantime, continue recommendations in your patient instructions, including avoidance measures (if applicable), until you hear from me.  Environmental allergies Start environmental control measures as below. Start Singulair (montelukast) 10mg  daily at night. Cautioned that in some children/adults can experience behavioral changes including hyperactivity, agitation, depression, sleep disturbances and suicidal ideations. These side effects are rare, but if you notice them you should notify me and discontinue Singulair (montelukast). Start Ryaltris (olopatadine + mometasone nasal spray combination) 1-2 sprays per nostril twice a day. Sample given. This will be mailed to you.  Use olopatadine eye drops once a day  as needed for itchy/watery eyes. Use over the counter antihistamines such as Zyrtec (cetirizine), Claritin (loratadine), Allegra (fexofenadine), or Xyzal (levocetirizine) daily as needed. May take twice a day during allergy flares. May switch antihistamines every few months. Consider allergy injections for long term control if above medications do not help the symptoms - handout given.   Food Discussed that her food triggered oral and throat symptoms are likely caused by oral food allergy syndrome (OFAS). This is caused by cross reactivity of pollen with fresh fruits and vegetables, and nuts. Symptoms are usually localized in the form of itching and burning in mouth and throat. Very rarely it can progress to more severe symptoms. Eating foods in cooked or processed forms usually minimizes symptoms. I recommended avoidance of eating the problem foods, especially during the peak season(s). Sometimes, OFAS can induce severe throat swelling or even a systemic reaction; with such instance, I advised them to report to a local ER. A list of common pollens and food cross-reactivities was provided to the patient.   Rash See below for proper skin care.  Follow up in 3 months or sooner if needed.    Reducing Pollen Exposure Pollen seasons: trees (spring), grass (summer) and ragweed/weeds (fall). Keep windows closed in your home and car to lower pollen exposure.  Install air conditioning in the bedroom and throughout the house if possible.  Avoid going out in dry windy days - especially early morning. Pollen counts are highest between 5 - 10 AM and on dry, hot and windy days.  Save outside activities for late afternoon or after a heavy rain, when pollen levels are lower.  Avoid mowing of  grass if you have grass pollen allergy. Be aware that pollen can also be transported indoors on people and pets.  Dry your clothes in an automatic dryer rather than hanging them outside where they might collect pollen.   Rinse hair and eyes before bedtime. Mold Control Mold and fungi can grow on a variety of surfaces provided certain temperature and moisture conditions exist.  Outdoor molds grow on plants, decaying vegetation and soil. The major outdoor mold, Alternaria and Cladosporium, are found in very high numbers during hot and dry conditions. Generally, a late summer - fall peak is seen for common outdoor fungal spores. Rain will temporarily lower outdoor mold spore count, but counts rise rapidly when the rainy period ends. The most important indoor molds are Aspergillus and Penicillium. Dark, humid and poorly ventilated basements are ideal sites for mold growth. The next most common sites of mold growth are the bathroom and the kitchen. Outdoor (Seasonal) Mold Control Use air conditioning and keep windows closed. Avoid exposure to decaying vegetation. Avoid leaf raking. Avoid grain handling. Consider wearing a face mask if working in moldy areas.  Indoor (Perennial) Mold Control  Maintain humidity below 50%. Get rid of mold growth on hard surfaces with water, detergent and, if necessary, 5% bleach (do not mix with other cleaners). Then dry the area completely. If mold covers an area more than 10 square feet, consider hiring an indoor environmental professional. For clothing, washing with soap and water is best. If moldy items cannot be cleaned and dried, throw them away. Remove sources e.g. contaminated carpets. Repair and seal leaking roofs or pipes. Using dehumidifiers in damp basements may be helpful, but empty the water and clean units regularly to prevent mildew from forming. All rooms, especially basements, bathrooms and kitchens, require ventilation and cleaning to deter mold and mildew growth. Avoid carpeting on concrete or damp floors, and storing items in damp areas. Control of House Dust Mite Allergen Dust mite allergens are a common trigger of allergy and asthma symptoms. While they can be  found throughout the house, these microscopic creatures thrive in warm, humid environments such as bedding, upholstered furniture and carpeting. Because so much time is spent in the bedroom, it is essential to reduce mite levels there.  Encase pillows, mattresses, and box springs in special allergen-proof fabric covers or airtight, zippered plastic covers.  Bedding should be washed weekly in hot water (130 F) and dried in a hot dryer. Allergen-proof covers are available for comforters and pillows that can't be regularly washed.  Wash the allergy-proof covers every few months. Minimize clutter in the bedroom. Keep pets out of the bedroom.  Keep humidity less than 50% by using a dehumidifier or air conditioning. You can buy a humidity measuring device called a hygrometer to monitor this.  If possible, replace carpets with hardwood, linoleum, or washable area rugs. If that's not possible, vacuum frequently with a vacuum that has a HEPA filter. Remove all upholstered furniture and non-washable window drapes from the bedroom. Remove all non-washable stuffed toys from the bedroom.  Wash stuffed toys weekly. Pet Allergen Avoidance: Contrary to popular opinion, there are no "hypoallergenic" breeds of dogs or cats. That is because people are not allergic to an animal's hair, but to an allergen found in the animal's saliva, dander (dead skin flakes) or urine. Pet allergy symptoms typically occur within minutes. For some people, symptoms can build up and become most severe 8 to 12 hours after contact with the animal. People with severe  allergies can experience reactions in public places if dander has been transported on the pet owners' clothing. Keeping an animal outdoors is only a partial solution, since homes with pets in the yard still have higher concentrations of animal allergens. Before getting a pet, ask your allergist to determine if you are allergic to animals. If your pet is already considered part of  your family, try to minimize contact and keep the pet out of the bedroom and other rooms where you spend a great deal of time. As with dust mites, vacuum carpets often or replace carpet with a hardwood floor, tile or linoleum. High-efficiency particulate air (HEPA) cleaners can reduce allergen levels over time. While dander and saliva are the source of cat and dog allergens, urine is the source of allergens from rabbits, hamsters, mice and [place] pigs; so ask a non-allergic family member to clean the animal's cage. If you have a pet allergy, talk to your allergist about the potential for allergy immunotherapy (allergy shots). This strategy can often provide long-term relief. Cockroach Allergen Avoidance Cockroaches are often found in the homes of densely populated urban areas, schools or commercial buildings, but these creatures can lurk almost anywhere. This does not mean that you have a dirty house or living area. Block all areas where roaches can enter the home. This includes crevices, wall cracks and windows.  Cockroaches need water to survive, so fix and seal all leaky faucets and pipes. Have an exterminator go through the house when your family and pets are gone to eliminate any remaining roaches. Keep food in lidded containers and put pet food dishes away after your pets are done eating. Vacuum and sweep the floor after meals, and take out garbage and recyclables. Use lidded garbage containers in the kitchen. Wash dishes immediately after use and clean under stoves, refrigerators or toasters where crumbs can accumulate. Wipe off the stove and other kitchen surfaces and cupboards regularly.   Skin care recommendations  Bath time: Always use lukewarm water. AVOID very hot or cold water. Keep bathing time to 5-10 minutes. Do NOT use bubble bath. Use a mild soap and use just enough to wash the dirty areas. Do NOT scrub skin vigorously.  After bathing, pat dry your skin with a towel. Do NOT rub  or scrub the skin.  Moisturizers and prescriptions:  ALWAYS apply moisturizers immediately after bathing (within 3 minutes). This helps to lock-in moisture. Use the moisturizer several times a day over the whole body. Good summer moisturizers include: Aveeno, CeraVe, Cetaphil. Good winter moisturizers include: Aquaphor, Vaseline, Cerave, Cetaphil, Eucerin, Vanicream. When using moisturizers along with medications, the moisturizer should be applied about one hour after applying the medication to prevent diluting effect of the medication or moisturize around where you applied the medications. When not using medications, the moisturizer can be continued twice daily as maintenance.  Laundry and clothing: Avoid laundry products with added color or perfumes. Use unscented hypo-allergenic laundry products such as Tide free, Cheer free & gentle, and All free and clear.  If the skin still seems dry or sensitive, you can try double-rinsing the clothes. Avoid tight or scratchy clothing such as wool. Do not use fabric softeners or dyer sheets.

## 2021-06-12 DIAGNOSIS — H1013 Acute atopic conjunctivitis, bilateral: Secondary | ICD-10-CM | POA: Insufficient documentation

## 2021-06-12 DIAGNOSIS — T7819XD Other adverse food reactions, not elsewhere classified, subsequent encounter: Secondary | ICD-10-CM | POA: Insufficient documentation

## 2021-06-12 DIAGNOSIS — R21 Rash and other nonspecific skin eruption: Secondary | ICD-10-CM | POA: Insufficient documentation

## 2021-06-12 DIAGNOSIS — J3089 Other allergic rhinitis: Secondary | ICD-10-CM | POA: Insufficient documentation

## 2021-06-12 DIAGNOSIS — T781XXD Other adverse food reactions, not elsewhere classified, subsequent encounter: Secondary | ICD-10-CM | POA: Insufficient documentation

## 2021-06-12 DIAGNOSIS — T50905A Adverse effect of unspecified drugs, medicaments and biological substances, initial encounter: Secondary | ICD-10-CM | POA: Insufficient documentation

## 2021-06-12 NOTE — Assessment & Plan Note (Signed)
.   See assessment and plan as above. 

## 2021-06-12 NOTE — Assessment & Plan Note (Signed)
Noted perioral discomfort with bananas and certain tree nuts.  Used to have issues with shrimp in the past but now tolerates with no problems.  Today's skin testing showed: Borderline to shellfish.  Negative to other select foods. . Discussed that her food triggered oral and throat symptoms are likely caused by oral food allergy syndrome (OFAS). This is caused by cross reactivity of pollen with fresh fruits and vegetables, and nuts. Symptoms are usually localized in the form of itching and burning in mouth and throat. Very rarely it can progress to more severe symptoms. Eating foods in cooked or processed forms usually minimizes symptoms. I recommended avoidance of eating the problem foods, especially during the peak season(s). Sometimes, OFAS can induce severe throat swelling or even a systemic reaction; with such instance, I advised them to report to a local ER. A list of common pollens and food cross-reactivities was provided to the patient.

## 2021-06-12 NOTE — Assessment & Plan Note (Signed)
Reaction to flu vaccine over 10+ years ago in the form of facial edema, shortness of breath, whole body pruritus and rash. Hospitalized once for treatment. Since then had Tdap with no issues. Up to date with childhood vaccines. Declined Covid-19 vaccines due to above reaction history. . Continue to avoid flu vaccine. . Discussed that the currently recommended Covid-19 vaccines are mrna based which is made differently than the flu vaccines. . If interested recommend skin testing to the flu vaccine in the future and if negative give the flu injection in a graded vaccine challenge in the office. Same procedure can be done for the Covid-19 vaccines.  o Patient not interested at this time.  . Get bloodwork for gelatin, latex and tryptase.

## 2021-06-12 NOTE — Assessment & Plan Note (Signed)
Rhinoconjunctivitis symptoms in the spring and fall.  Tried Zyrtec and Benadryl as needed with good benefit.  2005 skin testing showed multiple positives per patient report.  No prior AIT.  Today's skin testing showed: Positive to grass, weed pollen, trees, dust mites, mold, cat, dog, cockroach, mouse.  Borderline to horse.  Start environmental control measures as below.  Start Singulair (montelukast) 10mg  daily at night.  Cautioned that in some children/adults can experience behavioral changes including hyperactivity, agitation, depression, sleep disturbances and suicidal ideations. These side effects are rare, but if you notice them you should notify me and discontinue Singulair (montelukast).  Start Ryaltris (olopatadine + mometasone nasal spray combination) 1-2 sprays per nostril twice a day. Sample given.  This will be mailed to you.   Use olopatadine eye drops once a day as needed for itchy/watery eyes.  Use over the counter antihistamines such as Zyrtec (cetirizine), Claritin (loratadine), Allegra (fexofenadine), or Xyzal (levocetirizine) daily as needed. May take twice a day during allergy flares. May switch antihistamines every few months.  Consider allergy injections for long term control if above medications do not help the symptoms - handout given.

## 2021-06-12 NOTE — Assessment & Plan Note (Signed)
Patient going to see dermatology for some rash and dry skin. . See below for proper skin care. Marland Kitchen Keep dermatology appointment.

## 2021-06-14 LAB — TRYPTASE: Tryptase: 2.3 ug/L (ref 2.2–13.2)

## 2021-06-14 LAB — LATEX, IGE: Latex IgE: <0.1 kU/L

## 2021-06-14 LAB — C074-IGE GELATIN: C074-IgE Gelatin: <0.1 kU/L

## 2021-07-01 ENCOUNTER — Ambulatory Visit: Payer: Medicaid Other | Admitting: Dermatology

## 2021-07-01 ENCOUNTER — Ambulatory Visit: Payer: 59 | Admitting: Dermatology

## 2021-07-01 DIAGNOSIS — L219 Seborrheic dermatitis, unspecified: Secondary | ICD-10-CM

## 2021-07-01 MED ORDER — TACROLIMUS 0.1 % EX OINT: TOPICAL_OINTMENT | Freq: Every day | CUTANEOUS | 1 refills | Status: DC

## 2021-07-01 MED ORDER — CLOBETASOL PROPIONATE 0.05 % EX FOAM: Freq: Two times a day (BID) | CUTANEOUS | 0 refills | Status: DC

## 2021-07-13 ENCOUNTER — Other Ambulatory Visit: Payer: Self-pay | Admitting: Dermatology

## 2021-07-13 DIAGNOSIS — L219 Seborrheic dermatitis, unspecified: Secondary | ICD-10-CM

## 2021-07-23 ENCOUNTER — Encounter: Payer: Self-pay | Admitting: Dermatology

## 2021-07-23 NOTE — Progress Notes (Signed)
   New Patient   Subjective  Olivia Lawson is a 36 y.o. female who presents for the following: New Patient (Initial Visit) (Patient has some flaking on her skin and causes scabs sometimes. It starts on scalp. It does go on face too sometimes. It is leaving marks on face.  Patient did use the ketoconazole shampoo and it has helped. ).  Breaking out on face and scalp with some benefit from ketoconazole shampoo Location:  Duration:  Quality:  Associated Signs/Symptoms: Modifying Factors:  Severity:  Timing: Context:    The following portions of the chart were reviewed this encounter and updated as appropriate:  Tobacco  Allergies  Meds  Problems  Med Hx  Surg Hx  Fam Hx      Objective  Well appearing patient in no apparent distress; mood and affect are within normal limits. Head - Anterior (Face), Scalp Patchy scale scalp, central face.  Mild postinflammatory hyperpigmentation.    A focused examination was performed including the neck. Relevant physical exam findings are noted in the Assessment and Plan.   Assessment & Plan  Seborrheic dermatitis Head - Anterior (Face); Scalp  Tacrolimus ointment for the face nightly for 3 weeks, taper if improves.  Clobetasol foam for the scalp which will be used in the same fashion but which will be avoided on the face.  Follow-up by MyChart or telephone in 3 to 4 weeks.  tacrolimus (PROTOPIC) 0.1 % ointment - Head - Anterior (Face), Scalp Apply topically at bedtime.  clobetasol (OLUX) 0.05 % topical foam - Head - Anterior (Face), Scalp Apply topically 2 (two) times daily.

## 2021-07-26 ENCOUNTER — Other Ambulatory Visit: Payer: Self-pay | Admitting: Dermatology

## 2021-07-26 DIAGNOSIS — L219 Seborrheic dermatitis, unspecified: Secondary | ICD-10-CM

## 2022-01-10 DIAGNOSIS — R519 Headache, unspecified: Secondary | ICD-10-CM | POA: Diagnosis not present

## 2022-01-10 DIAGNOSIS — R0981 Nasal congestion: Secondary | ICD-10-CM | POA: Diagnosis not present

## 2022-03-23 DIAGNOSIS — Z6832 Body mass index (BMI) 32.0-32.9, adult: Secondary | ICD-10-CM | POA: Diagnosis not present

## 2022-03-23 DIAGNOSIS — M25562 Pain in left knee: Secondary | ICD-10-CM | POA: Diagnosis not present

## 2022-03-30 ENCOUNTER — Ambulatory Visit (INDEPENDENT_AMBULATORY_CARE_PROVIDER_SITE_OTHER): Payer: 59 | Admitting: Emergency Medicine

## 2022-03-30 ENCOUNTER — Encounter: Payer: Self-pay | Admitting: Emergency Medicine

## 2022-03-30 VITALS — BP 120/78 | HR 52 | Temp 98.5°F | Ht 66.0 in | Wt 210.0 lb

## 2022-03-30 DIAGNOSIS — Z1329 Encounter for screening for other suspected endocrine disorder: Secondary | ICD-10-CM | POA: Diagnosis not present

## 2022-03-30 DIAGNOSIS — Z13220 Encounter for screening for lipoid disorders: Secondary | ICD-10-CM | POA: Diagnosis not present

## 2022-03-30 DIAGNOSIS — Z13228 Encounter for screening for other metabolic disorders: Secondary | ICD-10-CM

## 2022-03-30 DIAGNOSIS — Z130 Encounter for screening for diseases of the blood and blood-forming organs and certain disorders involving the immune mechanism: Secondary | ICD-10-CM

## 2022-03-30 DIAGNOSIS — Z0000 Encounter for general adult medical examination without abnormal findings: Secondary | ICD-10-CM | POA: Diagnosis not present

## 2022-03-30 NOTE — Progress Notes (Signed)
Weston Settle 37 y.o.   Chief Complaint  Patient presents with   Establish Care    HISTORY OF PRESENT ILLNESS: This is a 37 y.o. female first visit to this office, here to establish care with me. Healthy female with a healthy lifestyle.  HPI   Prior to Admission medications   Medication Sig Start Date End Date Taking? Authorizing Provider  clobetasol (OLUX) 0.05 % topical foam APPLY TOPICALLY TWICE DAILY 07/28/21  Yes Lavonna Monarch, MD  ketoconazole (NIZORAL) 2 % shampoo Apply topically. 06/30/21  Yes [provider]  tacrolimus (PROTOPIC) 0.1 % ointment Apply topically at bedtime. 07/01/21  Yes Lavonna Monarch, MD  montelukast (SINGULAIR) 10 MG tablet Take 1 tablet (10 mg total) by mouth at bedtime. Patient not taking: Reported on 03/30/2022 06/11/21   Garnet Sierras, DO  Olopatadine-Mometasone [place]) [phone] MCG/ACT SUSP Place 1-2 sprays into the nose 2 (two) times daily. Patient not taking: Reported on 03/30/2022 06/11/21   Garnet Sierras, DO    Allergies  Allergen Reactions   Influenza A (H1n1) Monovalent Vaccine Swelling   Tioconazole Other (See Comments)   Monistat [Miconazole] Swelling and Rash    Ampule, 1 day insert.     Patient Active Problem List   Diagnosis Date Noted   Chronic headaches 11/28/2017    Past Medical History:  Diagnosis Date   Migraine    Placental abruption     Past Surgical History:  Procedure Laterality Date   CESAREAN SECTION     DENTAL SURGERY     HERNIA REPAIR      Social History   Socioeconomic History   Marital status: Married    Spouse name: Not on file   Number of children: Not on file   Years of education: Not on file   Highest education level: Not on file  Occupational History   Not on file  Tobacco Use   Smoking status: Never   Smokeless tobacco: Never  Substance and Sexual Activity   Alcohol use: No   Drug use: No   Sexual activity: Yes    Birth control/protection: None  Other Topics Concern   Not on file   Social History Narrative   Not on file   Social Determinants of Health   Financial Resource Strain: Not on file  Food Insecurity: Not on file  Transportation Needs: Not on file  Physical Activity: Not on file  Stress: Not on file  Social Connections: Not on file  Intimate Partner Violence: Not on file    No family history on file.   Review of Systems  Constitutional: Negative.  Negative for chills and fever.  HENT: Negative.  Negative for congestion and sore throat.   Respiratory: Negative.  Negative for cough and shortness of breath.   Cardiovascular: Negative.  Negative for chest pain and palpitations.  Gastrointestinal: Negative.  Negative for abdominal pain, diarrhea, nausea and vomiting.  Genitourinary: Negative.  Negative for dysuria and hematuria.  Musculoskeletal: Negative.   Skin: Negative.  Negative for rash.  Neurological: Negative.  Negative for dizziness and headaches.  All other systems reviewed and are negative.  Today's Vitals   03/30/22 1618  BP: 120/78  Pulse: (!) 52  Temp: 98.5 F (36.9 C)  TempSrc: Oral  SpO2: 98%  Weight: 210 lb (95.3 kg)  Height: '5\' 6"'$  (1.676 m)   Body mass index is 33.89 kg/m.   Physical Exam Vitals reviewed.  Constitutional:      Appearance: Normal appearance.  HENT:  Head: Normocephalic.  Eyes:     Extraocular Movements: Extraocular movements intact.     Conjunctiva/sclera: Conjunctivae normal.     Pupils: Pupils are equal, round, and reactive to light.  Cardiovascular:     Rate and Rhythm: Normal rate and regular rhythm.     Pulses: Normal pulses.     Heart sounds: Normal heart sounds.  Pulmonary:     Effort: Pulmonary effort is normal.     Breath sounds: Normal breath sounds.  Abdominal:     Palpations: Abdomen is soft.     Tenderness: There is no abdominal tenderness.  Musculoskeletal:     Cervical back: No tenderness.     Right lower leg: No edema.     Left lower leg: No edema.  Lymphadenopathy:      Cervical: No cervical adenopathy.  Skin:    General: Skin is warm and dry.     Capillary Refill: Capillary refill takes less than 2 seconds.  Neurological:     General: No focal deficit present.     Mental Status: She is alert and oriented to person, place, and time.  Psychiatric:        Mood and Affect: Mood normal.        Behavior: Behavior normal.      ASSESSMENT & PLAN: Problem List Items Addressed This Visit   None Visit Diagnoses     Routine general medical examination at a health care facility    -  Primary   Relevant Orders   CBC with Differential   Comprehensive metabolic panel   Lipid panel   Hemoglobin A1c   Screening for deficiency anemia       Relevant Orders   CBC with Differential   Screening for lipoid disorders       Relevant Orders   Lipid panel   Screening for endocrine, metabolic and immunity disorder       Relevant Orders   Comprehensive metabolic panel   Hemoglobin A1c   VITAMIN D 25 Hydroxy (Vit-D Deficiency, Fractures)      Modifiable risk factors discussed with patient. Anticipatory guidance according to age provided. The following topics were also discussed: Social Determinants of Health Smoking.  Non-smoker Diet and nutrition and need to decrease amount of daily carbohydrate intake and daily calories and increase amount of plant-based protein in her diet Benefits of exercise Cancer family history review Vaccinations reviewed and recommendations Cardiovascular risk assessment and need for blood work Mental health including depression and anxiety Fall and accident prevention  Patient Instructions  Health Maintenance, Female Adopting a healthy lifestyle and getting preventive care are important in promoting health and wellness. Ask your health care provider about: The right schedule for you to have regular tests and exams. Things you can do on your own to prevent diseases and keep yourself healthy. What should I know about diet,  weight, and exercise? Eat a healthy diet  Eat a diet that includes plenty of vegetables, fruits, low-fat dairy products, and lean protein. Do not eat a lot of foods that are high in solid fats, added sugars, or sodium. Maintain a healthy weight Body mass index (BMI) is used to identify weight problems. It estimates body fat based on height and weight. Your health care provider can help determine your BMI and help you achieve or maintain a healthy weight. Get regular exercise Get regular exercise. This is one of the most important things you can do for your health. Most adults should: Exercise for at least  150 minutes each week. The exercise should increase your heart rate and make you sweat (moderate-intensity exercise). Do strengthening exercises at least twice a week. This is in addition to the moderate-intensity exercise. Spend less time sitting. Even light physical activity can be beneficial. Watch cholesterol and blood lipids Have your blood tested for lipids and cholesterol at 38 years of age, then have this test every 5 years. Have your cholesterol levels checked more often if: Your lipid or cholesterol levels are high. You are older than 38 years of age. You are at high risk for heart disease. What should I know about cancer screening? Depending on your health history and family history, you may need to have cancer screening at various ages. This may include screening for: Breast cancer. Cervical cancer. Colorectal cancer. Skin cancer. Lung cancer. What should I know about heart disease, diabetes, and high blood pressure? Blood pressure and heart disease High blood pressure causes heart disease and increases the risk of stroke. This is more likely to develop in people who have high blood pressure readings or are overweight. Have your blood pressure checked: Every 3-5 years if you are 72-60 years of age. Every year if you are 23 years old or older. Diabetes Have regular  diabetes screenings. This checks your fasting blood sugar level. Have the screening done: Once every three years after age 63 if you are at a normal weight and have a low risk for diabetes. More often and at a younger age if you are overweight or have a high risk for diabetes. What should I know about preventing infection? Hepatitis B If you have a higher risk for hepatitis B, you should be screened for this virus. Talk with your health care provider to find out if you are at risk for hepatitis B infection. Hepatitis C Testing is recommended for: Everyone born from [birth year] through 1966. Anyone with known risk factors for hepatitis C. Sexually transmitted infections (STIs) Get screened for STIs, including gonorrhea and chlamydia, if: You are sexually active and are younger than 37 years of age. You are older than 38 years of age and your health care provider tells you that you are at risk for this type of infection. Your sexual activity has changed since you were last screened, and you are at increased risk for chlamydia or gonorrhea. Ask your health care provider if you are at risk. Ask your health care provider about whether you are at high risk for HIV. Your health care provider may recommend a prescription medicine to help prevent HIV infection. If you choose to take medicine to prevent HIV, you should first get tested for HIV. You should then be tested every 3 months for as long as you are taking the medicine. Pregnancy If you are about to stop having your period (premenopausal) and you may become pregnant, seek counseling before you get pregnant. Take 400 to 800 micrograms (mcg) of folic acid every day if you become pregnant. Ask for birth control (contraception) if you want to prevent pregnancy. Osteoporosis and menopause Osteoporosis is a disease in which the bones lose minerals and strength with aging. This can result in bone fractures. If you are 37 years old or older, or if you are at  risk for osteoporosis and fractures, ask your health care provider if you should: Be screened for bone loss. Take a calcium or vitamin D supplement to lower your risk of fractures. Be given hormone replacement therapy (HRT) to treat symptoms of menopause. Follow these  instructions at home: Alcohol use Do not drink alcohol if: Your health care provider tells you not to drink. You are pregnant, may be pregnant, or are planning to become pregnant. If you drink alcohol: Limit how much you have to: 0-1 drink a day. Know how much alcohol is in your drink. In the [place] one drink equals one 12 oz bottle of beer (355 mL), one 5 oz glass of wine (148 mL), or one 1 oz glass of hard liquor (44 mL). Lifestyle Do not use any products that contain nicotine or tobacco. These products include cigarettes, chewing tobacco, and vaping devices, such as e-cigarettes. If you need help quitting, ask your health care provider. Do not use street drugs. Do not share needles. Ask your health care provider for help if you need support or information about quitting drugs. General instructions Schedule regular health, dental, and eye exams. Stay current with your vaccines. Tell your health care provider if: You often feel depressed. You have ever been abused or do not feel safe at home. Summary Adopting a healthy lifestyle and getting preventive care are important in promoting health and wellness. Follow your health care provider's instructions about healthy diet, exercising, and getting tested or screened for diseases. Follow your health care provider's instructions on monitoring your cholesterol and blood pressure. This information is not intended to replace advice given to you by your health care provider. Make sure you discuss any questions you have with your health care provider. Document Revised: 05/27/2020 Document Reviewed: 05/27/2020 Elsevier Patient Education  [place],  MD [facility]

## 2022-03-30 NOTE — Patient Instructions (Signed)

## 2022-03-31 LAB — CBC WITH DIFFERENTIAL/PLATELET
Basophils Absolute: 0 10*3/uL (ref 0.0–0.1)
Basophils Relative: 0.6 % (ref 0.0–3.0)
Eosinophils Absolute: 0.4 10*3/uL (ref 0.0–0.7)
Eosinophils Relative: 8.7 % — ABNORMAL HIGH (ref 0.0–5.0)
HCT: 41.2 % (ref 36.0–46.0)
Hemoglobin: 13.8 g/dL (ref 12.0–15.0)
Lymphocytes Relative: 32.6 % (ref 12.0–46.0)
Lymphs Abs: 1.5 10*3/uL (ref 0.7–4.0)
MCHC: 33.5 g/dL (ref 30.0–36.0)
MCV: 90.4 fl (ref 78.0–100.0)
Monocytes Absolute: 0.5 10*3/uL (ref 0.1–1.0)
Monocytes Relative: 10.4 % (ref 3.0–12.0)
Neutro Abs: 2.2 10*3/uL (ref 1.4–7.7)
Neutrophils Relative %: 47.7 % (ref 43.0–77.0)
Platelets: 291 10*3/uL (ref 150.0–400.0)
RBC: 4.55 Mil/uL (ref 3.87–5.11)
RDW: 12.9 % (ref 11.5–15.5)
WBC: 4.6 10*3/uL (ref 4.0–10.5)

## 2022-03-31 LAB — LIPID PANEL
Cholesterol: 188 mg/dL (ref 0–200)
HDL: 80.1 mg/dL (ref >39.00)
LDL Cholesterol: 97 mg/dL (ref 0–99)
NonHDL: 107.54
Total CHOL/HDL Ratio: 2
Triglycerides: 52 mg/dL (ref 0.0–149.0)
VLDL: 10.4 mg/dL (ref 0.0–40.0)

## 2022-03-31 LAB — COMPREHENSIVE METABOLIC PANEL
ALT: 15 U/L (ref 0–35)
AST: 23 U/L (ref 0–37)
Albumin: 3.6 g/dL (ref 3.5–5.2)
Alkaline Phosphatase: 49 U/L (ref 39–117)
BUN: 18 mg/dL (ref 6–23)
CO2: 26 mEq/L (ref 19–32)
Calcium: 9.3 mg/dL (ref 8.4–10.5)
Chloride: 104 mEq/L (ref 96–112)
Creatinine, Ser: 0.72 mg/dL (ref 0.40–1.20)
GFR: 106.68 mL/min (ref >60.00)
Glucose, Bld: 82 mg/dL (ref 70–99)
Potassium: 3.9 mEq/L (ref 3.5–5.1)
Sodium: 137 mEq/L (ref 135–145)
Total Bilirubin: 0.5 mg/dL (ref 0.2–1.2)
Total Protein: 6.7 g/dL (ref 6.0–8.3)

## 2022-03-31 LAB — VITAMIN D 25 HYDROXY (VIT D DEFICIENCY, FRACTURES): VITD: 24.03 ng/mL — ABNORMAL LOW (ref 30.00–100.00)

## 2022-03-31 LAB — HEMOGLOBIN A1C: Hgb A1c MFr Bld: 5 % (ref 4.6–6.5)

## 2022-04-13 DIAGNOSIS — M25562 Pain in left knee: Secondary | ICD-10-CM | POA: Diagnosis not present

## 2022-06-23 ENCOUNTER — Encounter: Payer: Self-pay | Admitting: Emergency Medicine

## 2022-06-23 ENCOUNTER — Ambulatory Visit (INDEPENDENT_AMBULATORY_CARE_PROVIDER_SITE_OTHER): Payer: 59 | Admitting: Emergency Medicine

## 2022-06-23 VITALS — BP 126/78 | HR 58 | Temp 99.2°F | Ht 66.0 in | Wt 217.0 lb

## 2022-06-23 DIAGNOSIS — M25562 Pain in left knee: Secondary | ICD-10-CM

## 2022-06-23 DIAGNOSIS — G8929 Other chronic pain: Secondary | ICD-10-CM

## 2022-06-23 DIAGNOSIS — G43709 Chronic migraine without aura, not intractable, without status migrainosus: Secondary | ICD-10-CM | POA: Diagnosis not present

## 2022-06-23 DIAGNOSIS — Z6835 Body mass index (BMI) 35.0-35.9, adult: Secondary | ICD-10-CM | POA: Diagnosis not present

## 2022-06-23 DIAGNOSIS — E6609 Other obesity due to excess calories: Secondary | ICD-10-CM

## 2022-06-23 MED ORDER — WEGOVY 0.25 MG/0.5ML ~~LOC~~ SOAJ: 0.2500 mg | SUBCUTANEOUS | 3 refills | Status: DC

## 2022-06-23 NOTE — Progress Notes (Signed)
Olivia Lawson 36 y.o.   Chief Complaint  Patient presents with   Referral    To dietitian and been having more headaches and lower back pain    HISTORY OF PRESENT ILLNESS: This is a 37 y.o. female A1A concerned about weight.  Wants referral to nutritionist. Also has history of migraine headaches Chronic pain to left knee leading to lower back pain No other complaints or medical concerns today.  HPI   Prior to Admission medications   Medication Sig Start Date End Date Taking? Authorizing Provider  clobetasol (OLUX) 0.05 % topical foam APPLY TOPICALLY TWICE DAILY 07/28/21  Yes Janalyn Harder, MD  ketoconazole (NIZORAL) 2 % shampoo Apply topically. 06/30/21  Yes [provider]  meloxicam (MOBIC) 15 MG tablet 1 tablet Orally Once a day for 14 days As needed 04/15/22  Yes [provider]  tacrolimus (PROTOPIC) 0.1 % ointment Apply topically at bedtime. 07/01/21  Yes Janalyn Harder, MD  montelukast (SINGULAIR) 10 MG tablet Take 1 tablet (10 mg total) by mouth at bedtime. Patient not taking: Reported on 03/30/2022 06/11/21   Ellamae Sia, DO  Olopatadine-Mometasone [place]) [phone] MCG/ACT SUSP Place 1-2 sprays into the nose 2 (two) times daily. Patient not taking: Reported on 03/30/2022 06/11/21   Ellamae Sia, DO    Allergies  Allergen Reactions   Influenza A (H1n1) Monovalent Vaccine Swelling   Tioconazole Other (See Comments)   Monistat [Miconazole] Swelling and Rash    Ampule, 1 day insert.     Patient Active Problem List   Diagnosis Date Noted   Chronic headaches 11/28/2017    Past Medical History:  Diagnosis Date   Migraine    Placental abruption     Past Surgical History:  Procedure Laterality Date   CESAREAN SECTION     DENTAL SURGERY     HERNIA REPAIR      Social History   Socioeconomic History   Marital status: Married    Spouse name: Not on file   Number of children: Not on file   Years of education: Not on file   Highest education  level: Not on file  Occupational History   Not on file  Tobacco Use   Smoking status: Never   Smokeless tobacco: Never  Substance and Sexual Activity   Alcohol use: No   Drug use: No   Sexual activity: Yes    Birth control/protection: None  Other Topics Concern   Not on file  Social History Narrative   Not on file   Social Determinants of Health   Financial Resource Strain: Not on file  Food Insecurity: Not on file  Transportation Needs: Not on file  Physical Activity: Not on file  Stress: Not on file  Social Connections: Not on file  Intimate Partner Violence: Not on file    History reviewed. No pertinent family history.   Review of Systems  Constitutional: Negative.  Negative for chills and fever.  HENT: Negative.  Negative for congestion and sore throat.   Respiratory: Negative.  Negative for cough and shortness of breath.   Cardiovascular: Negative.  Negative for chest pain and palpitations.  Gastrointestinal:  Negative for nausea and vomiting.  Musculoskeletal:  Positive for back pain and joint pain (Chronic left knee pain).  Skin: Negative.  Negative for rash.  Neurological:  Positive for headaches.  All other systems reviewed and are negative.   Vitals:   06/23/22 1615  BP: 126/78  Pulse: (!) 58  Temp: 99.2 F (  37.3 C)  SpO2: 98%    Physical Exam Vitals reviewed.  Constitutional:      Appearance: Normal appearance.  HENT:     Head: Normocephalic.  Eyes:     Extraocular Movements: Extraocular movements intact.  Cardiovascular:     Rate and Rhythm: Normal rate.  Pulmonary:     Effort: Pulmonary effort is normal.  Skin:    General: Skin is warm and dry.  Neurological:     Mental Status: She is alert and oriented to person, place, and time.  Psychiatric:        Mood and Affect: Mood normal.        Behavior: Behavior normal.      ASSESSMENT & PLAN: A total of 42 minutes was spent with the patient and counseling/coordination of care  regarding preparing for this visit, review of most recent office visit notes, review of most recent blood work results, education on nutrition, cardiovascular risks related to excess weight, prognosis, documentation, and need for follow-up.  Problem List Items Addressed This Visit       Cardiovascular and Mediastinum   Chronic migraine without aura without status migrainosus, not intractable    Chronic and affecting quality of life Uses over-the-counter analgesics Does not want to try prescription medications yet      Relevant Medications   meloxicam (MOBIC) 15 MG tablet     Other   Class 2 obesity due to excess calories without serious comorbidity with body mass index (BMI) of 35.0 to 35.9 in adult - Primary    Affecting quality of life Feels headaches and chronic pains are related to excess weight Will refer to medical weight management clinic Recommend to start Wegovy at lower doses and increase as tolerated Diet and nutrition discussed Advised to decrease amount of daily carbohydrate intake and daily calories and increase amount of plant-based protein in her diet.      Relevant Medications   Semaglutide-Weight Management (WEGOVY) 0.25 MG/0.5ML SOAJ   Other Relevant Orders   Amb Ref to Medical Weight Management   Chronic pain of left knee    Affecting quality of life and leading to low back pain May need orthopedic evaluation      Relevant Medications   meloxicam (MOBIC) 15 MG tablet   Patient Instructions  Calorie Counting for Weight Loss Calories are units of energy. Your body needs a certain number of calories from food to keep going throughout the day. When you eat or drink more calories than your body needs, your body stores the extra calories mostly as fat. When you eat or drink fewer calories than your body needs, your body burns fat to get the energy it needs. Calorie counting means keeping track of how many calories you eat and drink each day. Calorie counting can  be helpful if you need to lose weight. If you eat fewer calories than your body needs, you should lose weight. Ask your health care provider what a healthy weight is for you. For calorie counting to work, you will need to eat the right number of calories each day to lose a healthy amount of weight per week. A dietitian can help you figure out how many calories you need in a day and will suggest ways to reach your calorie goal. A healthy amount of weight to lose each week is usually 1-2 lb (0.5-0.9 kg). This usually means that your daily calorie intake should be reduced by 500-750 calories. Eating 1,200-1,500 calories a day can help most  women lose weight. Eating 1,500-1,800 calories a day can help most men lose weight. What do I need to know about calorie counting? Work with your health care provider or dietitian to determine how many calories you should get each day. To meet your daily calorie goal, you will need to: Find out how many calories are in each food that you would like to eat. Try to do this before you eat. Decide how much of the food you plan to eat. Keep a food log. Do this by writing down what you ate and how many calories it had. To successfully lose weight, it is important to balance calorie counting with a healthy lifestyle that includes regular activity. Where do I find calorie information?  The number of calories in a food can be found on a Nutrition Facts label. If a food does not have a Nutrition Facts label, try to look up the calories online or ask your dietitian for help. Remember that calories are listed per serving. If you choose to have more than one serving of a food, you will have to multiply the calories per serving by the number of servings you plan to eat. For example, the label on a package of bread might say that a serving size is 1 slice and that there are 90 calories in a serving. If you eat 1 slice, you will have eaten 90 calories. If you eat 2 slices, you will  have eaten 180 calories. How do I keep a food log? After each time that you eat, record the following in your food log as soon as possible: What you ate. Be sure to include toppings, sauces, and other extras on the food. How much you ate. This can be measured in cups, ounces, or number of items. How many calories were in each food and drink. The total number of calories in the food you ate. Keep your food log near you, such as in a pocket-sized notebook or on an app or website on your mobile phone. Some programs will calculate calories for you and show you how many calories you have left to meet your daily goal. What are some portion-control tips? Know how many calories are in a serving. This will help you know how many servings you can have of a certain food. Use a measuring cup to measure serving sizes. You could also try weighing out portions on a kitchen scale. With time, you will be able to estimate serving sizes for some foods. Take time to put servings of different foods on your favorite plates or in your favorite bowls and cups so you know what a serving looks like. Try not to eat straight from a food's packaging, such as from a bag or box. Eating straight from the package makes it hard to see how much you are eating and can lead to overeating. Put the amount you would like to eat in a cup or on a plate to make sure you are eating the right portion. Use smaller plates, glasses, and bowls for smaller portions and to prevent overeating. Try not to multitask. For example, avoid watching TV or using your computer while eating. If it is time to eat, sit down at a table and enjoy your food. This will help you recognize when you are full. It will also help you be more mindful of what and how much you are eating. What are tips for following this plan? Reading food labels Check the calorie count compared with the  serving size. The serving size may be smaller than what you are used to eating. Check  the source of the calories. Try to choose foods that are high in protein, fiber, and vitamins, and low in saturated fat, trans fat, and sodium. Shopping Read nutrition labels while you shop. This will help you make healthy decisions about which foods to buy. Pay attention to nutrition labels for low-fat or fat-free foods. These foods sometimes have the same number of calories or more calories than the full-fat versions. They also often have added sugar, starch, or salt to make up for flavor that was removed with the fat. Make a grocery list of lower-calorie foods and stick to it. Cooking Try to cook your favorite foods in a healthier way. For example, try baking instead of frying. Use low-fat dairy products. Meal planning Use more fruits and vegetables. One-half of your plate should be fruits and vegetables. Include lean proteins, such as chicken, [place], and fish. Lifestyle Each week, aim to do one of the following: 150 minutes of moderate exercise, such as walking. 75 minutes of vigorous exercise, such as running. General information Know how many calories are in the foods you eat most often. This will help you calculate calorie counts faster. Find a way of tracking calories that works for you. Get creative. Try different apps or programs if writing down calories does not work for you. What foods should I eat?  Eat nutritious foods. It is better to have a nutritious, high-calorie food, such as an avocado, than a food with few nutrients, such as a bag of potato chips. Use your calories on foods and drinks that will fill you up and will not leave you hungry soon after eating. Examples of foods that fill you up are nuts and nut butters, vegetables, lean proteins, and high-fiber foods such as whole grains. High-fiber foods are foods with more than 5 g of fiber per serving. Pay attention to calories in drinks. Low-calorie drinks include water and unsweetened drinks. The items listed above may  not be a complete list of foods and beverages you can eat. Contact a dietitian for more information. What foods should I limit? Limit foods or drinks that are not good sources of vitamins, minerals, or protein or that are high in unhealthy fats. These include: Candy. Other sweets. Sodas, specialty coffee drinks, alcohol, and juice. The items listed above may not be a complete list of foods and beverages you should avoid. Contact a dietitian for more information. How do I count calories when eating out? Pay attention to portions. Often, portions are much larger when eating out. Try these tips to keep portions smaller: Consider sharing a meal instead of getting your own. If you get your own meal, eat only half of it. Before you start eating, ask for a container and put half of your meal into it. When available, consider ordering smaller portions from the menu instead of full portions. Pay attention to your food and drink choices. Knowing the way food is cooked and what is included with the meal can help you eat fewer calories. If calories are listed on the menu, choose the lower-calorie options. Choose dishes that include vegetables, fruits, whole grains, low-fat dairy products, and lean proteins. Choose items that are boiled, broiled, grilled, or steamed. Avoid items that are buttered, battered, fried, or served with cream sauce. Items labeled as crispy are usually fried, unless stated otherwise. Choose water, low-fat milk, unsweetened iced tea, or other drinks without added  sugar. If you want an alcoholic beverage, choose a lower-calorie option, such as a glass of wine or light beer. Ask for dressings, sauces, and syrups on the side. These are usually high in calories, so you should limit the amount you eat. If you want a salad, choose a garden salad and ask for grilled meats. Avoid extra toppings such as bacon, cheese, or fried items. Ask for the dressing on the side, or ask for olive oil and  vinegar or lemon to use as dressing. Estimate how many servings of a food you are given. Knowing serving sizes will help you be aware of how much food you are eating at restaurants. Where to find more information Centers for Disease Control and Prevention: FootballExhibition.com.br [place] Department of Agriculture: WrestlingReporter.dk Summary Calorie counting means keeping track of how many calories you eat and drink each day. If you eat fewer calories than your body needs, you should lose weight. A healthy amount of weight to lose per week is usually 1-2 lb (0.5-0.9 kg). This usually means reducing your daily calorie intake by 500-750 calories. The number of calories in a food can be found on a Nutrition Facts label. If a food does not have a Nutrition Facts label, try to look up the calories online or ask your dietitian for help. Use smaller plates, glasses, and bowls for smaller portions and to prevent overeating. Use your calories on foods and drinks that will fill you up and not leave you hungry shortly after a meal. This information is not intended to replace advice given to you by your health care provider. Make sure you discuss any questions you have with your health care provider. Document Revised: 02/16/2019 Document Reviewed: 02/16/2019 Elsevier Patient Education  2023 [employer]   Edwina Barth, MD [facility]

## 2022-06-23 NOTE — Patient Instructions (Signed)
Calorie Counting for Weight Loss Calories are units of energy. Your body needs a certain number of calories from food to keep going throughout the day. When you eat or drink more calories than your body needs, your body stores the extra calories mostly as fat. When you eat or drink fewer calories than your body needs, your body burns fat to get the energy it needs. Calorie counting means keeping track of how many calories you eat and drink each day. Calorie counting can be helpful if you need to lose weight. If you eat fewer calories than your body needs, you should lose weight. Ask your health care provider what a healthy weight is for you. For calorie counting to work, you will need to eat the right number of calories each day to lose a healthy amount of weight per week. A dietitian can help you figure out how many calories you need in a day and will suggest ways to reach your calorie goal. A healthy amount of weight to lose each week is usually 1-2 lb (0.5-0.9 kg). This usually means that your daily calorie intake should be reduced by 500-750 calories. Eating 1,200-1,500 calories a day can help most women lose weight. Eating 1,500-1,800 calories a day can help most men lose weight. What do I need to know about calorie counting? Work with your health care provider or dietitian to determine how many calories you should get each day. To meet your daily calorie goal, you will need to: Find out how many calories are in each food that you would like to eat. Try to do this before you eat. Decide how much of the food you plan to eat. Keep a food log. Do this by writing down what you ate and how many calories it had. To successfully lose weight, it is important to balance calorie counting with a healthy lifestyle that includes regular activity. Where do I find calorie information?  The number of calories in a food can be found on a Nutrition Facts label. If a food does not have a Nutrition Facts label, try  to look up the calories online or ask your dietitian for help. Remember that calories are listed per serving. If you choose to have more than one serving of a food, you will have to multiply the calories per serving by the number of servings you plan to eat. For example, the label on a package of bread might say that a serving size is 1 slice and that there are 90 calories in a serving. If you eat 1 slice, you will have eaten 90 calories. If you eat 2 slices, you will have eaten 180 calories. How do I keep a food log? After each time that you eat, record the following in your food log as soon as possible: What you ate. Be sure to include toppings, sauces, and other extras on the food. How much you ate. This can be measured in cups, ounces, or number of items. How many calories were in each food and drink. The total number of calories in the food you ate. Keep your food log near you, such as in a pocket-sized notebook or on an app or website on your mobile phone. Some programs will calculate calories for you and show you how many calories you have left to meet your daily goal. What are some portion-control tips? Know how many calories are in a serving. This will help you know how many servings you can have of a certain   food. Use a measuring cup to measure serving sizes. You could also try weighing out portions on a kitchen scale. With time, you will be able to estimate serving sizes for some foods. Take time to put servings of different foods on your favorite plates or in your favorite bowls and cups so you know what a serving looks like. Try not to eat straight from a food's packaging, such as from a bag or box. Eating straight from the package makes it hard to see how much you are eating and can lead to overeating. Put the amount you would like to eat in a cup or on a plate to make sure you are eating the right portion. Use smaller plates, glasses, and bowls for smaller portions and to prevent  overeating. Try not to multitask. For example, avoid watching TV or using your computer while eating. If it is time to eat, sit down at a table and enjoy your food. This will help you recognize when you are full. It will also help you be more mindful of what and how much you are eating. What are tips for following this plan? Reading food labels Check the calorie count compared with the serving size. The serving size may be smaller than what you are used to eating. Check the source of the calories. Try to choose foods that are high in protein, fiber, and vitamins, and low in saturated fat, trans fat, and sodium. Shopping Read nutrition labels while you shop. This will help you make healthy decisions about which foods to buy. Pay attention to nutrition labels for low-fat or fat-free foods. These foods sometimes have the same number of calories or more calories than the full-fat versions. They also often have added sugar, starch, or salt to make up for flavor that was removed with the fat. Make a grocery list of lower-calorie foods and stick to it. Cooking Try to cook your favorite foods in a healthier way. For example, try baking instead of frying. Use low-fat dairy products. Meal planning Use more fruits and vegetables. One-half of your plate should be fruits and vegetables. Include lean proteins, such as chicken, turkey, and fish. Lifestyle Each week, aim to do one of the following: 150 minutes of moderate exercise, such as walking. 75 minutes of vigorous exercise, such as running. General information Know how many calories are in the foods you eat most often. This will help you calculate calorie counts faster. Find a way of tracking calories that works for you. Get creative. Try different apps or programs if writing down calories does not work for you. What foods should I eat?  Eat nutritious foods. It is better to have a nutritious, high-calorie food, such as an avocado, than a food with  few nutrients, such as a bag of potato chips. Use your calories on foods and drinks that will fill you up and will not leave you hungry soon after eating. Examples of foods that fill you up are nuts and nut butters, vegetables, lean proteins, and high-fiber foods such as whole grains. High-fiber foods are foods with more than 5 g of fiber per serving. Pay attention to calories in drinks. Low-calorie drinks include water and unsweetened drinks. The items listed above may not be a complete list of foods and beverages you can eat. Contact a dietitian for more information. What foods should I limit? Limit foods or drinks that are not good sources of vitamins, minerals, or protein or that are high in unhealthy fats. These   include: Candy. Other sweets. Sodas, specialty coffee drinks, alcohol, and juice. The items listed above may not be a complete list of foods and beverages you should avoid. Contact a dietitian for more information. How do I count calories when eating out? Pay attention to portions. Often, portions are much larger when eating out. Try these tips to keep portions smaller: Consider sharing a meal instead of getting your own. If you get your own meal, eat only half of it. Before you start eating, ask for a container and put half of your meal into it. When available, consider ordering smaller portions from the menu instead of full portions. Pay attention to your food and drink choices. Knowing the way food is cooked and what is included with the meal can help you eat fewer calories. If calories are listed on the menu, choose the lower-calorie options. Choose dishes that include vegetables, fruits, whole grains, low-fat dairy products, and lean proteins. Choose items that are boiled, broiled, grilled, or steamed. Avoid items that are buttered, battered, fried, or served with cream sauce. Items labeled as crispy are usually fried, unless stated otherwise. Choose water, low-fat milk,  unsweetened iced tea, or other drinks without added sugar. If you want an alcoholic beverage, choose a lower-calorie option, such as a glass of wine or light beer. Ask for dressings, sauces, and syrups on the side. These are usually high in calories, so you should limit the amount you eat. If you want a salad, choose a garden salad and ask for grilled meats. Avoid extra toppings such as bacon, cheese, or fried items. Ask for the dressing on the side, or ask for olive oil and vinegar or lemon to use as dressing. Estimate how many servings of a food you are given. Knowing serving sizes will help you be aware of how much food you are eating at restaurants. Where to find more information Centers for Disease Control and Prevention: www.cdc.gov [place] Department of Agriculture: myplate.gov Summary Calorie counting means keeping track of how many calories you eat and drink each day. If you eat fewer calories than your body needs, you should lose weight. A healthy amount of weight to lose per week is usually 1-2 lb (0.5-0.9 kg). This usually means reducing your daily calorie intake by 500-750 calories. The number of calories in a food can be found on a Nutrition Facts label. If a food does not have a Nutrition Facts label, try to look up the calories online or ask your dietitian for help. Use smaller plates, glasses, and bowls for smaller portions and to prevent overeating. Use your calories on foods and drinks that will fill you up and not leave you hungry shortly after a meal. This information is not intended to replace advice given to you by your health care provider. Make sure you discuss any questions you have with your health care provider. Document Revised: 02/16/2019 Document Reviewed: 02/16/2019 Elsevier Patient Education  2023 Elsevier Inc.  

## 2022-06-23 NOTE — Assessment & Plan Note (Signed)
Affecting quality of life and leading to low back pain May need orthopedic evaluation

## 2022-06-23 NOTE — Assessment & Plan Note (Signed)
Affecting quality of life Feels headaches and chronic pains are related to excess weight Will refer to medical weight management clinic Recommend to start Wegovy at lower doses and increase as tolerated Diet and nutrition discussed Advised to decrease amount of daily carbohydrate intake and daily calories and increase amount of plant-based protein in her diet.

## 2022-06-23 NOTE — Assessment & Plan Note (Signed)
Chronic and affecting quality of life Uses over-the-counter analgesics Does not want to try prescription medications yet

## 2022-06-26 ENCOUNTER — Other Ambulatory Visit (HOSPITAL_COMMUNITY): Payer: Self-pay

## 2022-06-26 ENCOUNTER — Telehealth: Payer: Self-pay | Admitting: Pharmacy Technician

## 2022-06-26 NOTE — Telephone Encounter (Signed)
Patient Advocate Encounter   Received notification that prior authorization for Wegovy 0.25MG /0.5ML auto-injectors is required.   PA submitted on 06/26/2022 [facility] Insurance [employer] Electronic Prior Authorization Form Status is pending

## 2022-07-16 ENCOUNTER — Encounter (INDEPENDENT_AMBULATORY_CARE_PROVIDER_SITE_OTHER): Payer: Self-pay

## 2022-07-29 ENCOUNTER — Encounter (INDEPENDENT_AMBULATORY_CARE_PROVIDER_SITE_OTHER): Payer: 59 | Admitting: Internal Medicine

## 2022-08-02 DIAGNOSIS — G43009 Migraine without aura, not intractable, without status migrainosus: Secondary | ICD-10-CM | POA: Diagnosis not present

## 2022-08-13 DIAGNOSIS — Z0289 Encounter for other administrative examinations: Secondary | ICD-10-CM

## 2022-08-19 ENCOUNTER — Encounter (INDEPENDENT_AMBULATORY_CARE_PROVIDER_SITE_OTHER): Payer: 59 | Admitting: Family Medicine

## 2022-08-20 ENCOUNTER — Encounter (INDEPENDENT_AMBULATORY_CARE_PROVIDER_SITE_OTHER): Payer: 59 | Admitting: Family Medicine

## 2022-08-21 NOTE — Telephone Encounter (Signed)
Pharmacy Patient Advocate Encounter  Received notification from [facility] MEDICAID that Prior Authorization for [facility] 0.25MG /0.5ML auto-injectors  has been DENIED. Please advise how you'd like to proceed. Full denial letter will be uploaded to the media tab. See denial reason below. The requested medication and/or diagnosis are not a covered benefit and excluded from coverage in accordance with the terms and conditions of your plan benefit. Therefore, the request has been administratively denied  PA #/Case ID/Reference #: [id number]

## 2022-08-27 ENCOUNTER — Ambulatory Visit (INDEPENDENT_AMBULATORY_CARE_PROVIDER_SITE_OTHER): Payer: 59 | Admitting: Emergency Medicine

## 2022-08-27 ENCOUNTER — Encounter: Payer: Self-pay | Admitting: Emergency Medicine

## 2022-08-27 VITALS — BP 114/80 | HR 56 | Temp 98.2°F | Ht 66.0 in | Wt 220.0 lb

## 2022-08-27 DIAGNOSIS — G43719 Chronic migraine without aura, intractable, without status migrainosus: Secondary | ICD-10-CM

## 2022-08-27 MED ORDER — SUMATRIPTAN SUCCINATE 100 MG PO TABS: ORAL_TABLET | ORAL | 3 refills | Status: DC

## 2022-08-27 NOTE — Patient Instructions (Signed)
Migraine Headache A migraine headache is a very strong throbbing pain on one or both sides of your head. This type of headache can also cause other symptoms. It can last from 4 hours to 3 days. Talk with your doctor about what things may bring on (trigger) this condition. What are the causes? The exact cause of a migraine is not known. This condition may be brought on or caused by: Smoking. Medicines, such as: Medicine used to treat chest pain (nitroglycerin). Birth control pills. Estrogen. Some blood pressure medicines. Certain substances in some foods or drinks. Foods and drinks, such as: Cheese. Chocolate. Alcohol. Caffeine. Doing physical activity that is very hard. Other things that may trigger a migraine headache include: Periods. Pregnancy. Hunger. Stress. Getting too much or too little sleep. Weather changes. Feeling tired (fatigue). What increases the risk? Being 25-55 years old. Being female. Having a family history of migraine headaches. Being Caucasian. Having a mental health condition, such as being sad (depressed) or feeling worried or nervous (anxious). Being very overweight (obese). What are the signs or symptoms? A throbbing pain. This pain may: Happen in any area of the head, such as on one or both sides. Make it hard to do daily activities. Get worse with physical activity. Get worse around bright lights, loud noises, or smells. Other symptoms may include: Feeling like you may vomit (nauseous). Vomiting. Dizziness. Before a migraine headache starts, you may get warning signs (an aura). An aura may include: Seeing flashing lights or having blind spots. Seeing bright spots, halos, or zigzag lines. Having tunnel vision or blurred vision. Having numbness or a tingling feeling. Having trouble talking. Having weak muscles. After a migraine ends, you may have symptoms. These may include: Tiredness. Trouble thinking (concentrating). How is this  treated? Taking medicines that: Relieve pain. Relieve the feeling like you may vomit. Prevent migraine headaches. Treatment may also include: Acupuncture. Lifestyle changes like avoiding foods that bring on migraine headaches. Learning ways to control your body functions (biofeedback). Therapy to help you know and deal with negative thoughts (cognitive behavioral therapy). Follow these instructions at home: Medicines Take over-the-counter and prescription medicines only as told by your doctor. If told, take steps to prevent problems with pooping (constipation). You may need to: Drink enough fluid to keep your pee (urine) pale yellow. Take medicines. You will be told what medicines to take. Eat foods that are high in fiber. These include beans, whole grains, and fresh fruits and vegetables. Limit foods that are high in fat and sugar. These include fried or sweet foods. Ask your doctor if you should avoid driving or using machines while you are taking your medicine. Lifestyle  Do not drink alcohol. Do not smoke or use any products that contain nicotine or tobacco. If you need help quitting, ask your doctor. Get 7-9 hours of sleep each night, or the amount recommended by your doctor. Find ways to deal with stress, such as meditation, deep breathing, or yoga. Try to exercise often. This can help lessen how bad and how often your migraines happen. General instructions Keep a journal to find out what may bring on your migraine headaches. This can help you avoid those things. For example, write down: What you eat and drink. How much sleep you get. Any change to your medicines or diet. If you have a migraine headache: Avoid things that make your symptoms worse, such as bright lights. Lie down in a dark, quiet room. Do not drive or use machinery. Ask your   doctor what activities are safe for you. Where to find more information Coalition for Headache and Migraine Patients (CHAMP):  headachemigraine.org American Migraine Foundation: americanmigrainefoundation.org National Headache Foundation: headaches.org Contact a doctor if: You get a migraine headache that is different or worse than others you have had. You have more than 15 days of headaches in one month. Get help right away if: Your migraine headache gets very bad. Your migraine headache lasts more than 72 hours. You have a fever or stiff neck. You have trouble seeing. Your muscles feel weak or like you cannot control them. You lose your balance a lot. You have trouble walking. You faint. You have a seizure. This information is not intended to replace advice given to you by your health care provider. Make sure you discuss any questions you have with your health care provider. Document Revised: 09/01/2021 Document Reviewed: 09/01/2021 Elsevier Patient Education  2024 Elsevier Inc.  

## 2022-08-27 NOTE — Assessment & Plan Note (Signed)
Chronic migraine headaches since childhood. Active and affecting quality of life. Never treated with chronic medications. Never formally evaluated by neurologist Over-the-counter medications not helping. Recommend to start with Imitrex 100 mg at onset of headache Recommend neurology evaluation.  Referral placed today.

## 2022-08-27 NOTE — Progress Notes (Signed)
Olivia Lawson 37 y.o.   Chief Complaint  Patient presents with   Migraine    Pt complains of headaches for the past 6 months but recently has gotten worse at the end of July, to the point she couldn't move. Left eye is constantly twitching since July 12. Pt is only taking tylenol which has not helped. She recently almost gotten into car accident because of the headaches with some blurred vision. States she's had blurred vision 6-7 times within the past 30days.      HISTORY OF PRESENT ILLNESS: This is a 37 y.o. female complaining of persistent, almost daily migraine headaches affecting her quality of life Has had migraine headaches since age 60-10.  Never seen by neurologist in the past. Over-the-counter medications not helping.  Migraine  Associated symptoms include blurred vision. Pertinent negatives include no abdominal pain, coughing, fever, nausea, sore throat or vomiting.     Prior to Admission medications   Medication Sig Start Date End Date Taking? Authorizing Provider  clobetasol (OLUX) 0.05 % topical foam APPLY TOPICALLY TWICE DAILY 07/28/21   Janalyn Harder, MD  ketoconazole (NIZORAL) 2 % shampoo Apply topically. 06/30/21   [provider]  meloxicam (MOBIC) 15 MG tablet 1 tablet Orally Once a day for 14 days As needed 04/15/22   [provider]  montelukast (SINGULAIR) 10 MG tablet Take 1 tablet (10 mg total) by mouth at bedtime. Patient not taking: Reported on 03/30/2022 06/11/21   Ellamae Sia, DO  Olopatadine-Mometasone [place]) [phone] MCG/ACT SUSP Place 1-2 sprays into the nose 2 (two) times daily. Patient not taking: Reported on 03/30/2022 06/11/21   Ellamae Sia, DO  Semaglutide-Weight Management (WEGOVY) 0.25 MG/0.5ML SOAJ Inject 0.25 mg into the skin once a week. 06/23/22   Georgina Quint, MD  tacrolimus (PROTOPIC) 0.1 % ointment Apply topically at bedtime. 07/01/21   Janalyn Harder, MD    Allergies  Allergen Reactions   Influenza A (H1n1)  Monovalent Vaccine Swelling   Tioconazole Other (See Comments)   Monistat [Miconazole] Swelling and Rash    Ampule, 1 day insert.     Patient Active Problem List   Diagnosis Date Noted   Chronic migraine without aura without status migrainosus, not intractable 06/23/2022   Chronic pain of left knee 06/23/2022   Chronic headaches 11/28/2017   Class 2 obesity due to excess calories without serious comorbidity with body mass index (BMI) of 35.0 to 35.9 in adult 05/10/2008    Past Medical History:  Diagnosis Date   Migraine    Placental abruption     Past Surgical History:  Procedure Laterality Date   CESAREAN SECTION     DENTAL SURGERY     HERNIA REPAIR      Social History   Socioeconomic History   Marital status: Married    Spouse name: Not on file   Number of children: Not on file   Years of education: Not on file   Highest education level: Not on file  Occupational History   Not on file  Tobacco Use   Smoking status: Never   Smokeless tobacco: Never  Substance and Sexual Activity   Alcohol use: No   Drug use: No   Sexual activity: Yes    Birth control/protection: None  Other Topics Concern   Not on file  Social History Narrative   Not on file   Social Determinants of Health   Financial Resource Strain: Not on file  Food Insecurity: Not on file  Transportation Needs: Not on file  Physical Activity: Not on file  Stress: Not on file  Social Connections: Unknown (01/10/2022)   Received from [facility], [facility]   Social Network    Social Network: Not on file  Intimate Partner Violence: Unknown (01/10/2022)   Received from [facility], [facility]   HITS    Physically Hurt: Not on file    Insult or Talk Down To: Not on file    Threaten Physical Harm: Not on file    Scream or Curse: Not on file    No family history on file.   Review of Systems  Constitutional: Negative.  Negative for chills and fever.  HENT: Negative.  Negative for  congestion and sore throat.   Eyes:  Positive for blurred vision.  Respiratory: Negative.  Negative for cough and shortness of breath.   Cardiovascular: Negative.  Negative for chest pain and palpitations.  Gastrointestinal:  Negative for abdominal pain, diarrhea, nausea and vomiting.  Genitourinary: Negative.  Negative for dysuria and hematuria.  Skin: Negative.  Negative for rash.  Neurological:  Positive for headaches.  All other systems reviewed and are negative.   Vitals:   08/27/22 1525  BP: 114/80  Pulse: (!) 56  Temp: 98.2 F (36.8 C)  SpO2: 99%    Physical Exam Vitals reviewed.  Constitutional:      Appearance: Normal appearance.  HENT:     Head: Normocephalic.     Mouth/Throat:     Mouth: Mucous membranes are moist.     Pharynx: Oropharynx is clear.  Eyes:     Extraocular Movements: Extraocular movements intact.     Conjunctiva/sclera: Conjunctivae normal.     Pupils: Pupils are equal, round, and reactive to light.  Cardiovascular:     Rate and Rhythm: Normal rate and regular rhythm.     Pulses: Normal pulses.     Heart sounds: Normal heart sounds.  Pulmonary:     Effort: Pulmonary effort is normal.     Breath sounds: Normal breath sounds.  Musculoskeletal:     Cervical back: No tenderness.  Lymphadenopathy:     Cervical: No cervical adenopathy.  Skin:    General: Skin is warm and dry.  Neurological:     General: No focal deficit present.     Mental Status: She is alert and oriented to person, place, and time.     Cranial Nerves: No cranial nerve deficit.     Sensory: No sensory deficit.     Motor: No weakness.     Coordination: Coordination normal.     Gait: Gait normal.  Psychiatric:        Mood and Affect: Mood normal.        Behavior: Behavior normal.      ASSESSMENT & PLAN: A total of 42 minutes was spent with the patient and counseling/coordination of care regarding preparing for this visit, review of most recent office visit notes,  diagnosis of intractable migraine headaches and need for treatment and neurology evaluation, prognosis, ED precautions, education on nutrition, documentation, and need for follow-up.  Problem List Items Addressed This Visit       Cardiovascular and Mediastinum   Intractable chronic migraine without aura and without status migrainosus - Primary    Chronic migraine headaches since childhood. Active and affecting quality of life. Never treated with chronic medications. Never formally evaluated by neurologist Over-the-counter medications not helping. Recommend to start with Imitrex 100 mg at onset of headache Recommend neurology evaluation.  Referral placed today.      Relevant Medications   SUMAtriptan (IMITREX) 100 MG tablet   Other Relevant Orders   Ambulatory referral to Neurology   Patient Instructions  Migraine Headache A migraine headache is a very strong throbbing pain on one or both sides of your head. This type of headache can also cause other symptoms. It can last from 4 hours to 3 days. Talk with your doctor about what things may bring on (trigger) this condition. What are the causes? The exact cause of a migraine is not known. This condition may be brought on or caused by: Smoking. Medicines, such as: Medicine used to treat chest pain (nitroglycerin). Birth control pills. Estrogen. Some blood pressure medicines. Certain substances in some foods or drinks. Foods and drinks, such as: Cheese. Chocolate. Alcohol. Caffeine. Doing physical activity that is very hard. Other things that may trigger a migraine headache include: Periods. Pregnancy. Hunger. Stress. Getting too much or too little sleep. Weather changes. Feeling tired (fatigue). What increases the risk? Being 68-24 years old. Being female. Having a family history of migraine headaches. Being Caucasian. Having a mental health condition, such as being sad (depressed) or feeling worried or nervous  (anxious). Being very overweight (obese). What are the signs or symptoms? A throbbing pain. This pain may: Happen in any area of the head, such as on one or both sides. Make it hard to do daily activities. Get worse with physical activity. Get worse around bright lights, loud noises, or smells. Other symptoms may include: Feeling like you may vomit (nauseous). Vomiting. Dizziness. Before a migraine headache starts, you may get warning signs (an aura). An aura may include: Seeing flashing lights or having blind spots. Seeing bright spots, halos, or zigzag lines. Having tunnel vision or blurred vision. Having numbness or a tingling feeling. Having trouble talking. Having weak muscles. After a migraine ends, you may have symptoms. These may include: Tiredness. Trouble thinking (concentrating). How is this treated? Taking medicines that: Relieve pain. Relieve the feeling like you may vomit. Prevent migraine headaches. Treatment may also include: Acupuncture. Lifestyle changes like avoiding foods that bring on migraine headaches. Learning ways to control your body functions (biofeedback). Therapy to help you know and deal with negative thoughts (cognitive behavioral therapy). Follow these instructions at home: Medicines Take over-the-counter and prescription medicines only as told by your doctor. If told, take steps to prevent problems with pooping (constipation). You may need to: Drink enough fluid to keep your pee (urine) pale yellow. Take medicines. You will be told what medicines to take. Eat foods that are high in fiber. These include beans, whole grains, and fresh fruits and vegetables. Limit foods that are high in fat and sugar. These include fried or sweet foods. Ask your doctor if you should avoid driving or using machines while you are taking your medicine. Lifestyle  Do not drink alcohol. Do not smoke or use any products that contain nicotine or tobacco. If you need  help quitting, ask your doctor. Get 7-9 hours of sleep each night, or the amount recommended by your doctor. Find ways to deal with stress, such as meditation, deep breathing, or yoga. Try to exercise often. This can help lessen how bad and how often your migraines happen. General instructions Keep a journal to find out what may bring on your migraine headaches. This can help you avoid those things. For example, write down: What you eat and drink. How much sleep you get. Any change to your medicines  or diet. If you have a migraine headache: Avoid things that make your symptoms worse, such as bright lights. Lie down in a dark, quiet room. Do not drive or use machinery. Ask your doctor what activities are safe for you. Where to find more information Coalition for Headache and Migraine Patients (CHAMP): headachemigraine.org American Migraine Foundation: americanmigrainefoundation.org National Headache Foundation: headaches.org Contact a doctor if: You get a migraine headache that is different or worse than others you have had. You have more than 15 days of headaches in one month. Get help right away if: Your migraine headache gets very bad. Your migraine headache lasts more than 72 hours. You have a fever or stiff neck. You have trouble seeing. Your muscles feel weak or like you cannot control them. You lose your balance a lot. You have trouble walking. You faint. You have a seizure. This information is not intended to replace advice given to you by your health care provider. Make sure you discuss any questions you have with your health care provider. Document Revised: 09/01/2021 Document Reviewed: 09/01/2021 Elsevier Patient Education  2024 Elsevier Inc.     Edwina Barth, MD [facility]

## 2022-08-28 ENCOUNTER — Telehealth: Payer: Self-pay | Admitting: Emergency Medicine

## 2022-08-28 DIAGNOSIS — G43719 Chronic migraine without aura, intractable, without status migrainosus: Secondary | ICD-10-CM

## 2022-08-28 NOTE — Telephone Encounter (Signed)
Patient was seen 08/27/22 by Dr. Alvy Bimler for migraines. She was still having eye twitching and would like to know what he would recommend to help. Patient would like a call back at [phone].

## 2022-08-31 NOTE — Telephone Encounter (Signed)
Called patient and informed her of provider recommendation 

## 2022-08-31 NOTE — Telephone Encounter (Signed)
On 08/27/2022 she was also referred to neurology.  Eye twitching most likely secondary to migraine process.  She will probably need to be started on chronic migraine medication to be determined by neurologist.  Eye twitching could also be related to stress, lack of sleep, dehydration among many other possibilities.  Thanks.

## 2022-09-05 DIAGNOSIS — G43709 Chronic migraine without aura, not intractable, without status migrainosus: Secondary | ICD-10-CM | POA: Diagnosis not present

## 2022-09-05 DIAGNOSIS — R110 Nausea: Secondary | ICD-10-CM | POA: Diagnosis not present

## 2022-09-09 ENCOUNTER — Emergency Department (HOSPITAL_COMMUNITY): Payer: 59

## 2022-09-09 ENCOUNTER — Emergency Department (HOSPITAL_COMMUNITY)
Admission: EM | Admit: 2022-09-09 | Discharge: 2022-09-10 | Disposition: A | Discharge status: Home / Self Care | Payer: 59 | Attending: Student | Admitting: Student

## 2022-09-09 ENCOUNTER — Other Ambulatory Visit: Payer: Self-pay

## 2022-09-09 DIAGNOSIS — R519 Headache, unspecified: Secondary | ICD-10-CM | POA: Diagnosis not present

## 2022-09-09 DIAGNOSIS — G43711 Chronic migraine without aura, intractable, with status migrainosus: Secondary | ICD-10-CM | POA: Insufficient documentation

## 2022-09-09 LAB — BASIC METABOLIC PANEL
Anion gap: 10 (ref 5–15)
BUN: 11 mg/dL (ref 6–20)
CO2: 24 mmol/L (ref 22–32)
Calcium: 9.4 mg/dL (ref 8.9–10.3)
Chloride: 106 mmol/L (ref 98–111)
Creatinine, Ser: 1.03 mg/dL — ABNORMAL HIGH (ref 0.44–1.00)
GFR, Estimated: >60 mL/min (ref >60)
Glucose, Bld: 106 mg/dL — ABNORMAL HIGH (ref 70–99)
Potassium: 3.6 mmol/L (ref 3.5–5.1)
Sodium: 140 mmol/L (ref 135–145)

## 2022-09-09 LAB — CBC WITH DIFFERENTIAL/PLATELET
Abs Immature Granulocytes: 0.02 10*3/uL (ref 0.00–0.07)
Basophils Absolute: 0 10*3/uL (ref 0.0–0.1)
Basophils Relative: 1 %
Eosinophils Absolute: 0.2 10*3/uL (ref 0.0–0.5)
Eosinophils Relative: 4 %
HCT: 41.2 % (ref 36.0–46.0)
Hemoglobin: 13.7 g/dL (ref 12.0–15.0)
Immature Granulocytes: 0 %
Lymphocytes Relative: 36 %
Lymphs Abs: 1.9 10*3/uL (ref 0.7–4.0)
MCH: 29.8 pg (ref 26.0–34.0)
MCHC: 33.3 g/dL (ref 30.0–36.0)
MCV: 89.8 fL (ref 80.0–100.0)
Monocytes Absolute: 0.5 10*3/uL (ref 0.1–1.0)
Monocytes Relative: 9 %
Neutro Abs: 2.5 10*3/uL (ref 1.7–7.7)
Neutrophils Relative %: 50 %
Platelets: 285 10*3/uL (ref 150–400)
RBC: 4.59 MIL/uL (ref 3.87–5.11)
RDW: 12 % (ref 11.5–15.5)
WBC: 5.1 10*3/uL (ref 4.0–10.5)
nRBC: 0 % (ref 0.0–0.2)

## 2022-09-09 NOTE — ED Triage Notes (Signed)
Patient reports chronic migraine headache for several months worse these past weeks unrelieved by prescription medications with photophobia and right eye blurred vision and tinnitus , no emesis or fever.

## 2022-09-09 NOTE — ED Provider Triage Note (Signed)
Emergency Medicine Provider Triage Evaluation Note  Olivia Lawson , a 37 y.o. female  was evaluated in triage.  Pt complains of concerns for migraine times several weeks.  Notes that she was previously taking her sumatriptan.  No meds recently.  Was seen in urgent care and given muscle relaxers on 8/17.  Her migraine is localized to the left side of her head.  Has associated photophobia, phonophobia, right eye blurred vision, tinnitus.  Has a follow-up appointment with her neurologist in November.  According to the urgent care note, has had chronic migraines since she was a teen. Denies numbness, tingling, emesis.  This headache feels similar to her migraines.  Review of Systems  Positive:  Negative:   Physical Exam  BP 125/88   Pulse 69   Temp 98.5 F (36.9 C)   Resp 15   SpO2 98%  Gen:   Awake, no distress   Resp:  Normal effort MSK:   Moves extremities without difficulty  Other:  PERRL.  EOMI.  No focal neurological deficits noted on exam.  Strength sensation intact to bilateral upper and lower extremities.  Personally 5/5 bilaterally.  Negative pronator drift.  Medical Decision Making  Medically screening exam initiated at 9:41 PM.  Appropriate orders placed.  Olivia Lawson was informed that the remainder of the evaluation will be completed by another provider, this initial triage assessment does not replace that evaluation, and the importance of remaining in the ED until their evaluation is complete.  Workup initiated   Mikeya Tomasetti A, PA-C 09/09/22 2141

## 2022-09-09 NOTE — Telephone Encounter (Signed)
Pt called back stating she have appt with the neurology in October which her headaches are becoming more painful stabbing like pains. Pt wants to know what Dr. Alvy Bimler recommends for her. Please advise.

## 2022-09-10 DIAGNOSIS — G43711 Chronic migraine without aura, intractable, with status migrainosus: Secondary | ICD-10-CM | POA: Diagnosis not present

## 2022-09-10 MED ORDER — ACETAMINOPHEN 500 MG PO TABS
1000.0000 mg | ORAL_TABLET | Freq: Once | ORAL | Status: AC
  Administered 2022-09-10: 1000 mg via ORAL
  Filled 2022-09-10: qty 2

## 2022-09-10 MED ORDER — METOCLOPRAMIDE HCL 5 MG/ML IJ SOLN
10.0000 mg | Freq: Once | INTRAMUSCULAR | Status: AC
  Administered 2022-09-10: 10 mg via INTRAVENOUS
  Filled 2022-09-10: qty 2

## 2022-09-10 MED ORDER — KETOROLAC TROMETHAMINE 15 MG/ML IJ SOLN
15.0000 mg | Freq: Once | INTRAMUSCULAR | Status: AC
  Administered 2022-09-10: 15 mg via INTRAVENOUS
  Filled 2022-09-10: qty 1

## 2022-09-10 MED ORDER — DIPHENHYDRAMINE HCL 50 MG/ML IJ SOLN
25.0000 mg | Freq: Once | INTRAMUSCULAR | Status: AC
  Administered 2022-09-10: 25 mg via INTRAVENOUS
  Filled 2022-09-10: qty 1

## 2022-09-10 MED ORDER — SODIUM CHLORIDE 0.9 % IV BOLUS
1000.0000 mL | Freq: Once | INTRAVENOUS | Status: AC
  Administered 2022-09-10: 1000 mL via INTRAVENOUS

## 2022-09-10 MED ORDER — DEXAMETHASONE SODIUM PHOSPHATE 10 MG/ML IJ SOLN
10.0000 mg | Freq: Once | INTRAMUSCULAR | Status: AC
  Administered 2022-09-10: 10 mg via INTRAVENOUS
  Filled 2022-09-10: qty 1

## 2022-09-10 NOTE — ED Provider Notes (Signed)
Olivia Lawson Provider Note   CSN: [id number] Arrival date & time: 09/09/22  2115     History  Chief Complaint  Patient presents with   Migraine    Olivia Lawson is a 37 y.o. female with past medical history migraines who presents to the ED complaining of an uncontrolled headache.  States that she has had difficult to control headaches for the last 2 weeks that have been intermittent.  She was prescribed sumatriptan by her doctor but has not had much relief with this.  She has not taken this medication today.  States that it is her typical headache which is left-sided with blurry vision, nausea, photophobia and phonophobia though has been more frequent and intense.  She has an outpatient appointment with neurology scheduled for October. Denies chance of pregnancy.     Home Medications Prior to Admission medications   Medication Sig Start Date End Date Taking? Authorizing Provider  clobetasol (OLUX) 0.05 % topical foam APPLY TOPICALLY TWICE DAILY 07/28/21   Janalyn Harder, MD  ketoconazole (NIZORAL) 2 % shampoo Apply topically. 06/30/21   [provider]  meloxicam (MOBIC) 15 MG tablet 1 tablet Orally Once a day for 14 days As needed 04/15/22   [provider]  montelukast (SINGULAIR) 10 MG tablet Take 1 tablet (10 mg total) by mouth at bedtime. Patient not taking: Reported on 03/30/2022 06/11/21   Ellamae Sia, DO  Olopatadine-Mometasone [place]) [phone] MCG/ACT SUSP Place 1-2 sprays into the nose 2 (two) times daily. Patient not taking: Reported on 03/30/2022 06/11/21   Ellamae Sia, DO  Semaglutide-Weight Management (WEGOVY) 0.25 MG/0.5ML SOAJ Inject 0.25 mg into the skin once a week. 06/23/22   Georgina Quint, MD  SUMAtriptan (IMITREX) 100 MG tablet Take 100 mg at onset of headache.  May repeat in 2 hours if headache persists or recurs.  Max daily dose 200 mg. 08/27/22   Georgina Quint, MD  tacrolimus (PROTOPIC) 0.1 %  ointment Apply topically at bedtime. 07/01/21   Janalyn Harder, MD      Allergies    Influenza a (h1n1) monovalent vaccine, Tioconazole, and Monistat [miconazole]    Review of Systems   Review of Systems  All other systems reviewed and are negative.   Physical Exam Updated Vital Signs BP 125/88   Pulse 69   Temp 98.5 F (36.9 C)   Resp 15   SpO2 98%  Physical Exam Vitals and nursing note reviewed.  Constitutional:      General: She is not in acute distress.    Appearance: Normal appearance. She is not ill-appearing or toxic-appearing.  HENT:     Head: Normocephalic and atraumatic.     Mouth/Throat:     Mouth: Mucous membranes are moist.  Eyes:     Extraocular Movements: Extraocular movements intact.     Conjunctiva/sclera: Conjunctivae normal.     Pupils: Pupils are equal, round, and reactive to light.  Neck:     Comments: No meningismus Cardiovascular:     Rate and Rhythm: Normal rate and regular rhythm.     Heart sounds: No murmur heard. Pulmonary:     Effort: Pulmonary effort is normal.     Breath sounds: Normal breath sounds.  Abdominal:     General: Abdomen is flat.     Palpations: Abdomen is soft.     Tenderness: There is no abdominal tenderness.  Musculoskeletal:        General: Normal range of  motion.     Cervical back: Normal range of motion and neck supple.     Right lower leg: No edema.     Left lower leg: No edema.  Skin:    General: Skin is warm and dry.     Capillary Refill: Capillary refill takes less than 2 seconds.  Neurological:     General: No focal deficit present.     Mental Status: She is alert and oriented to person, place, and time.     GCS: GCS eye subscore is 4. GCS verbal subscore is 5. GCS motor subscore is 6.     Cranial Nerves: Cranial nerves 2-12 are intact. No cranial nerve deficit, dysarthria or facial asymmetry.     Sensory: Sensation is intact.     Motor: Motor function is intact. No weakness, tremor, atrophy, abnormal muscle  tone or seizure activity.     Coordination: Coordination is intact.  Psychiatric:        Behavior: Behavior normal.     ED Results / Procedures / Treatments   Labs (all labs ordered are listed, but only abnormal results are displayed) Labs Reviewed  BASIC METABOLIC PANEL - Abnormal; Notable for the following components:      Result Value   Glucose, Bld 106 (*)    Creatinine, Ser 1.03 (*)    All other components within normal limits  CBC WITH DIFFERENTIAL/PLATELET    EKG None  Radiology CT Head Wo Contrast  Result Date: 09/09/2022 CLINICAL DATA:  Headache EXAM: CT HEAD WITHOUT CONTRAST TECHNIQUE: Contiguous axial images were obtained from the base of the skull through the vertex without intravenous contrast. RADIATION DOSE REDUCTION: This exam was performed according to the departmental dose-optimization program which includes automated exposure control, adjustment of the mA and/or kV according to patient size and/or use of iterative reconstruction technique. COMPARISON:  Head CT 09/30/2011 FINDINGS: Brain: No evidence of acute infarction, hemorrhage, hydrocephalus, extra-axial collection or mass lesion/mass effect. Vascular: No hyperdense vessel or unexpected calcification. Skull: Normal. Negative for fracture or focal lesion. Sinuses/Orbits: No acute finding. Other: None. IMPRESSION: No acute intracranial abnormality. Electronically Signed   By: Darliss Cheney M.D.   On: 09/09/2022 23:43    Procedures Procedures    Medications Ordered in ED Medications  ketorolac (TORADOL) 15 MG/ML injection 15 mg (15 mg Intravenous Given 09/10/22 0049)  metoCLOPramide (REGLAN) injection 10 mg (10 mg Intravenous Given 09/10/22 0049)  dexamethasone (DECADRON) injection 10 mg (10 mg Intravenous Given 09/10/22 0049)  diphenhydrAMINE (BENADRYL) injection 25 mg (25 mg Intravenous Given 09/10/22 0050)  sodium chloride 0.9 % bolus 1,000 mL (1,000 mLs Intravenous New Bag/Given 09/10/22 0048)  acetaminophen  (TYLENOL) tablet 1,000 mg (1,000 mg Oral Given 09/10/22 0050)    ED Course/ Medical Decision Making/ A&P                                 Medical Decision Making Amount and/or Complexity of Data Reviewed Labs: ordered. Decision-making details documented in ED Course. Radiology: ordered. Decision-making details documented in ED Course.  Risk OTC drugs. Prescription drug management.   Medical Decision Making:   Olivia Lawson is a 37 y.o. female who presented to the ED today with headache detailed above.    Additional history discussed with patient's family/caregivers.  Patient's presentation is complicated by their history of migraines.  Complete initial physical exam performed, notably the patient was in no acute distress, nontoxic-appearing.  No meningismus.  Afebrile.  Neurologically intact.    Reviewed and confirmed nursing documentation for past medical history, family history, social history.    Initial Assessment:   With the patient's presentation of headache, differential diagnosis includes but is not limited to emergent considerations for headache include subarachnoid hemorrhage, meningitis, temporal arteritis, glaucoma, cerebral ischemia, carotid/vertebral dissection, intracranial tumor, venous sinus thrombosis, carbon monoxide poisoning, acute or chronic subdural hemorrhage.  Other considerations include: migraine, cluster headache, hypertension, caffeine, alcohol, or drug withdrawal, pseudotumor cerebri, arteriovenous malformation, head injury, neurocysticercosis, post-lumbar puncture, preeclampsia in those assigned female at birth and in reproductive age, tension headache, viral vs acute bacterial sinusitis, cervical arthritis, refractive error causing strain, temporomandibular joint syndrome, depression, somatoform disorder (eg, somatization), trigeminal neuralgia, glossopharyngeal neuralgia.  This is most consistent with an acute complicated illness  Initial Plan:  Screening  labs including CBC and Metabolic panel to evaluate for infectious or metabolic etiology of disease.  CT brain to assess for intracranial pathology Symptomatic management Objective evaluation as below reviewed   Initial Study Results:   Laboratory  All laboratory results reviewed without evidence of clinically relevant pathology.   Exceptions include: Creatinine 1.03  Radiology:  All images reviewed independently. Agree with radiology report at this time.   CT Head Wo Contrast  Result Date: 09/09/2022 CLINICAL DATA:  Headache EXAM: CT HEAD WITHOUT CONTRAST TECHNIQUE: Contiguous axial images were obtained from the base of the skull through the vertex without intravenous contrast. RADIATION DOSE REDUCTION: This exam was performed according to the departmental dose-optimization program which includes automated exposure control, adjustment of the mA and/or kV according to patient size and/or use of iterative reconstruction technique. COMPARISON:  Head CT 09/30/2011 FINDINGS: Brain: No evidence of acute infarction, hemorrhage, hydrocephalus, extra-axial collection or mass lesion/mass effect. Vascular: No hyperdense vessel or unexpected calcification. Skull: Normal. Negative for fracture or focal lesion. Sinuses/Orbits: No acute finding. Other: None. IMPRESSION: No acute intracranial abnormality. Electronically Signed   By: Darliss Cheney M.D.   On: 09/09/2022 23:43      Final Assessment and Plan:   37 year old female presents to the ED complaining of migraine headache.  She is neurologically intact.  Nontoxic-appearing, afebrile, no meningismus.  Labs and CT scan ordered from triage unremarkable.  Slight bump in kidney function, does not meet AKI criteria.  Suspect secondary to dehydration in the setting of persistent migraine headache.  She has trialed sumatriptan at home without relief.  Not currently on any daily preventatives.  No URI symptoms.  Headache cocktail given with significant improvement in  intensity of headache.  Discussed with patient recommendation for close follow-up with neurology which she does have scheduled for October and is currently on the wait list for cancellations.  Also discussed recommendation that she meet with her PCP and discuss if she should be started on any preventatives while awaiting this neurology follow-up given her debilitating headaches that are currently keeping her from work and interfering with her life.  Patient and husband agreeable with plan.  Patient remained neurologically intact with good headache control.  Stable for discharge.  Strict ED return precautions given and all questions answered.   Clinical Impression:  1. Intractable chronic migraine without aura and with status migrainosus      Discharge           Final Clinical Impression(s) / ED Diagnoses Final diagnoses:  Intractable chronic migraine without aura and with status migrainosus    Rx / DC Orders ED Discharge Orders  None         Richardson Dopp 09/10/22 0158    Glendora Score, MD 09/10/22 [phone]

## 2022-09-10 NOTE — ED Notes (Signed)
..  The patient is A&OX4, ambulatory at d/c with independent steady gait, but wheeled out of ED via wheelchair. NAD. Pt verbalized understanding of d/c instructions and follow up care.

## 2022-09-10 NOTE — Discharge Instructions (Signed)
Thank you for letting us take care of you today.  Your labs and CT scan were normal.  We gave you medications to treat your headache.  I recommend seeing your PCP this week to discuss if you should be started on any daily medications while awaiting follow-up with neurology.  Continue medications as recommended by her outpatient team.  I recommend keeping a headache record so that you have this available when you follow-up with neurology.  I attached an example of one that you may use or you may use one of your own choosing.  For new or worsening symptoms, return to the nearest ED for reevaluation.

## 2022-09-14 ENCOUNTER — Encounter: Payer: Self-pay | Admitting: Emergency Medicine

## 2022-09-14 NOTE — Telephone Encounter (Signed)
Patient states that when she takes the Sumatriptan it makes her symptoms feel a lot worse that before. She states the feeling is only when she initially takes the medication after about an hr she states she starts to feel better.  Please advise

## 2022-09-14 NOTE — Telephone Encounter (Signed)
Called patient and informed her of provider recommendation 

## 2022-09-14 NOTE — Telephone Encounter (Signed)
Try different neurologist.

## 2022-09-14 NOTE — Telephone Encounter (Signed)
Try half a tablet, 50 mg instead.  See if it makes a difference.

## 2022-09-14 NOTE — Addendum Note (Signed)
Addended by: Jerrell Belfast on: 09/14/2022 02:45 PM   Modules accepted: Orders

## 2022-09-23 ENCOUNTER — Ambulatory Visit (INDEPENDENT_AMBULATORY_CARE_PROVIDER_SITE_OTHER): Payer: 59 | Admitting: Emergency Medicine

## 2022-09-23 ENCOUNTER — Encounter: Payer: Self-pay | Admitting: Emergency Medicine

## 2022-09-23 VITALS — BP 124/78 | HR 62 | Temp 97.7°F | Wt 222.0 lb

## 2022-09-23 DIAGNOSIS — G43719 Chronic migraine without aura, intractable, without status migrainosus: Secondary | ICD-10-CM | POA: Diagnosis not present

## 2022-09-23 NOTE — Progress Notes (Signed)
Olivia Lawson 37 y.o.   Chief Complaint  Patient presents with   Acute Visit    Frequent migraines, started x7 mnths ago, gotten worse x3 mnths ago. Dizziness, nausea and vomiting, twitching in the left eye, sensitivity to light    HISTORY OF PRESENT ILLNESS: This is a 37 y.o. female here to discuss FMLA papers regarding chronic migraine headaches Presently asymptomatic. Has appointment to see neurologist next week 09/29/2022 Imitrex working No other complaints or medical concerns today.  HPI   Prior to Admission medications   Medication Sig Start Date End Date Taking? Authorizing Provider  clobetasol (OLUX) 0.05 % topical foam APPLY TOPICALLY TWICE DAILY 07/28/21   Janalyn Harder, MD  ketoconazole (NIZORAL) 2 % shampoo Apply topically. 06/30/21   [provider]  meloxicam (MOBIC) 15 MG tablet 1 tablet Orally Once a day for 14 days As needed 04/15/22   [provider]  montelukast (SINGULAIR) 10 MG tablet Take 1 tablet (10 mg total) by mouth at bedtime. Patient not taking: Reported on 03/30/2022 06/11/21   Ellamae Sia, DO  Olopatadine-Mometasone [place]) [phone] MCG/ACT SUSP Place 1-2 sprays into the nose 2 (two) times daily. Patient not taking: Reported on 03/30/2022 06/11/21   Ellamae Sia, DO  Semaglutide-Weight Management (WEGOVY) 0.25 MG/0.5ML SOAJ Inject 0.25 mg into the skin once a week. 06/23/22   Georgina Quint, MD  SUMAtriptan (IMITREX) 100 MG tablet Take 100 mg at onset of headache.  May repeat in 2 hours if headache persists or recurs.  Max daily dose 200 mg. 08/27/22   Georgina Quint, MD  tacrolimus (PROTOPIC) 0.1 % ointment Apply topically at bedtime. 07/01/21   Janalyn Harder, MD    Allergies  Allergen Reactions   Influenza A (H1n1) Monovalent Vaccine Swelling   Tioconazole Other (See Comments)   Monistat [Miconazole] Swelling and Rash    Ampule, 1 day insert.     Patient Active Problem List   Diagnosis Date Noted   Intractable chronic  migraine without aura and without status migrainosus 08/27/2022   Chronic migraine without aura without status migrainosus, not intractable 06/23/2022   Chronic pain of left knee 06/23/2022   Chronic headaches 11/28/2017   Class 2 obesity due to excess calories without serious comorbidity with body mass index (BMI) of 35.0 to 35.9 in adult 05/10/2008    Past Medical History:  Diagnosis Date   Migraine    Placental abruption     Past Surgical History:  Procedure Laterality Date   CESAREAN SECTION     DENTAL SURGERY     HERNIA REPAIR      Social History   Socioeconomic History   Marital status: Married    Spouse name: Not on file   Number of children: Not on file   Years of education: Not on file   Highest education level: Not on file  Occupational History   Not on file  Tobacco Use   Smoking status: Never   Smokeless tobacco: Never  Substance and Sexual Activity   Alcohol use: No   Drug use: No   Sexual activity: Yes    Birth control/protection: None  Other Topics Concern   Not on file  Social History Narrative   Not on file   Social Determinants of Health   Financial Resource Strain: Not on file  Food Insecurity: Not on file  Transportation Needs: Not on file  Physical Activity: Not on file  Stress: Not on file  Social Connections: Unknown (01/10/2022)  Received from [facility], [facility]   Social Network    Social Network: Not on file  Intimate Partner Violence: Unknown (01/10/2022)   Received from [facility], [facility]   HITS    Physically Hurt: Not on file    Insult or Talk Down To: Not on file    Threaten Physical Harm: Not on file    Scream or Curse: Not on file    No family history on file.   Review of Systems  Constitutional: Negative.  Negative for chills and fever.  HENT: Negative.  Negative for congestion and sore throat.   Respiratory: Negative.  Negative for cough and shortness of breath.   Cardiovascular: Negative.   Negative for chest pain and palpitations.  Gastrointestinal:  Negative for abdominal pain, diarrhea, nausea and vomiting.  Genitourinary: Negative.  Negative for dysuria and hematuria.  Neurological:  Positive for headaches. Negative for dizziness.  All other systems reviewed and are negative.   Today's Vitals   09/23/22 1402  BP: 124/78  Pulse: 62  Temp: 97.7 F (36.5 C)  TempSrc: Oral  SpO2: 96%  Weight: 222 lb (100.7 kg)   Body mass index is 35.83 kg/m.   Physical Exam Constitutional:      Appearance: Normal appearance.  HENT:     Head: Normocephalic.  Eyes:     Extraocular Movements: Extraocular movements intact.  Cardiovascular:     Rate and Rhythm: Normal rate.  Pulmonary:     Effort: Pulmonary effort is normal.  Skin:    General: Skin is warm and dry.  Neurological:     Mental Status: She is alert and oriented to person, place, and time.  Psychiatric:        Mood and Affect: Mood normal.        Behavior: Behavior normal.      ASSESSMENT & PLAN: A total of 32 minutes was spent with the patient and counseling/coordination of care regarding preparing for this visit, review of most recent office visit notes, review of all medications, diagnosis of chronic migraine and prognosis, need for neurology evaluation, prognosis, documentation and need for follow-up.  Problem List Items Addressed This Visit       Cardiovascular and Mediastinum   Intractable chronic migraine without aura and without status migrainosus - Primary    Chronic and intermittent and affecting quality of life Imitrex working Scheduled to see neurologist next week Clinically stable.  No concerns at present time.      Relevant Medications   acetaminophen (TYLENOL) 500 MG tablet   Patient Instructions  Migraine Headache A migraine headache is a very strong throbbing pain on one or both sides of your head. This type of headache can also cause other symptoms. It can last from 4 hours to 3  days. Talk with your doctor about what things may bring on (trigger) this condition. What are the causes? The exact cause of a migraine is not known. This condition may be brought on or caused by: Smoking. Medicines, such as: Medicine used to treat chest pain (nitroglycerin). Birth control pills. Estrogen. Some blood pressure medicines. Certain substances in some foods or drinks. Foods and drinks, such as: Cheese. Chocolate. Alcohol. Caffeine. Doing physical activity that is very hard. Other things that may trigger a migraine headache include: Periods. Pregnancy. Hunger. Stress. Getting too much or too little sleep. Weather changes. Feeling tired (fatigue). What increases the risk? Being 49-80 years old. Being female. Having a family history of migraine headaches. Being Caucasian.  Having a mental health condition, such as being sad (depressed) or feeling worried or nervous (anxious). Being very overweight (obese). What are the signs or symptoms? A throbbing pain. This pain may: Happen in any area of the head, such as on one or both sides. Make it hard to do daily activities. Get worse with physical activity. Get worse around bright lights, loud noises, or smells. Other symptoms may include: Feeling like you may vomit (nauseous). Vomiting. Dizziness. Before a migraine headache starts, you may get warning signs (an aura). An aura may include: Seeing flashing lights or having blind spots. Seeing bright spots, halos, or zigzag lines. Having tunnel vision or blurred vision. Having numbness or a tingling feeling. Having trouble talking. Having weak muscles. After a migraine ends, you may have symptoms. These may include: Tiredness. Trouble thinking (concentrating). How is this treated? Taking medicines that: Relieve pain. Relieve the feeling like you may vomit. Prevent migraine headaches. Treatment may also include: Acupuncture. Lifestyle changes like avoiding  foods that bring on migraine headaches. Learning ways to control your body functions (biofeedback). Therapy to help you know and deal with negative thoughts (cognitive behavioral therapy). Follow these instructions at home: Medicines Take over-the-counter and prescription medicines only as told by your doctor. If told, take steps to prevent problems with pooping (constipation). You may need to: Drink enough fluid to keep your pee (urine) pale yellow. Take medicines. You will be told what medicines to take. Eat foods that are high in fiber. These include beans, whole grains, and fresh fruits and vegetables. Limit foods that are high in fat and sugar. These include fried or sweet foods. Ask your doctor if you should avoid driving or using machines while you are taking your medicine. Lifestyle  Do not drink alcohol. Do not smoke or use any products that contain nicotine or tobacco. If you need help quitting, ask your doctor. Get 7-9 hours of sleep each night, or the amount recommended by your doctor. Find ways to deal with stress, such as meditation, deep breathing, or yoga. Try to exercise often. This can help lessen how bad and how often your migraines happen. General instructions Keep a journal to find out what may bring on your migraine headaches. This can help you avoid those things. For example, write down: What you eat and drink. How much sleep you get. Any change to your medicines or diet. If you have a migraine headache: Avoid things that make your symptoms worse, such as bright lights. Lie down in a dark, quiet room. Do not drive or use machinery. Ask your doctor what activities are safe for you. Where to find more information Coalition for Headache and Migraine Patients (CHAMP): headachemigraine.org American Migraine Foundation: americanmigrainefoundation.org National Headache Foundation: headaches.org Contact a doctor if: You get a migraine headache that is different or  worse than others you have had. You have more than 15 days of headaches in one month. Get help right away if: Your migraine headache gets very bad. Your migraine headache lasts more than 72 hours. You have a fever or stiff neck. You have trouble seeing. Your muscles feel weak or like you cannot control them. You lose your balance a lot. You have trouble walking. You faint. You have a seizure. This information is not intended to replace advice given to you by your health care provider. Make sure you discuss any questions you have with your health care provider. Document Revised: 09/01/2021 Document Reviewed: 09/01/2021 Elsevier Patient Education  2024 [employer].  Edwina Barth, MD [facility]

## 2022-09-23 NOTE — Patient Instructions (Signed)
Migraine Headache A migraine headache is a very strong throbbing pain on one or both sides of your head. This type of headache can also cause other symptoms. It can last from 4 hours to 3 days. Talk with your doctor about what things may bring on (trigger) this condition. What are the causes? The exact cause of a migraine is not known. This condition may be brought on or caused by: Smoking. Medicines, such as: Medicine used to treat chest pain (nitroglycerin). Birth control pills. Estrogen. Some blood pressure medicines. Certain substances in some foods or drinks. Foods and drinks, such as: Cheese. Chocolate. Alcohol. Caffeine. Doing physical activity that is very hard. Other things that may trigger a migraine headache include: Periods. Pregnancy. Hunger. Stress. Getting too much or too little sleep. Weather changes. Feeling tired (fatigue). What increases the risk? Being 25-55 years old. Being female. Having a family history of migraine headaches. Being Caucasian. Having a mental health condition, such as being sad (depressed) or feeling worried or nervous (anxious). Being very overweight (obese). What are the signs or symptoms? A throbbing pain. This pain may: Happen in any area of the head, such as on one or both sides. Make it hard to do daily activities. Get worse with physical activity. Get worse around bright lights, loud noises, or smells. Other symptoms may include: Feeling like you may vomit (nauseous). Vomiting. Dizziness. Before a migraine headache starts, you may get warning signs (an aura). An aura may include: Seeing flashing lights or having blind spots. Seeing bright spots, halos, or zigzag lines. Having tunnel vision or blurred vision. Having numbness or a tingling feeling. Having trouble talking. Having weak muscles. After a migraine ends, you may have symptoms. These may include: Tiredness. Trouble thinking (concentrating). How is this  treated? Taking medicines that: Relieve pain. Relieve the feeling like you may vomit. Prevent migraine headaches. Treatment may also include: Acupuncture. Lifestyle changes like avoiding foods that bring on migraine headaches. Learning ways to control your body functions (biofeedback). Therapy to help you know and deal with negative thoughts (cognitive behavioral therapy). Follow these instructions at home: Medicines Take over-the-counter and prescription medicines only as told by your doctor. If told, take steps to prevent problems with pooping (constipation). You may need to: Drink enough fluid to keep your pee (urine) pale yellow. Take medicines. You will be told what medicines to take. Eat foods that are high in fiber. These include beans, whole grains, and fresh fruits and vegetables. Limit foods that are high in fat and sugar. These include fried or sweet foods. Ask your doctor if you should avoid driving or using machines while you are taking your medicine. Lifestyle  Do not drink alcohol. Do not smoke or use any products that contain nicotine or tobacco. If you need help quitting, ask your doctor. Get 7-9 hours of sleep each night, or the amount recommended by your doctor. Find ways to deal with stress, such as meditation, deep breathing, or yoga. Try to exercise often. This can help lessen how bad and how often your migraines happen. General instructions Keep a journal to find out what may bring on your migraine headaches. This can help you avoid those things. For example, write down: What you eat and drink. How much sleep you get. Any change to your medicines or diet. If you have a migraine headache: Avoid things that make your symptoms worse, such as bright lights. Lie down in a dark, quiet room. Do not drive or use machinery. Ask your   doctor what activities are safe for you. Where to find more information Coalition for Headache and Migraine Patients (CHAMP):  headachemigraine.org American Migraine Foundation: americanmigrainefoundation.org National Headache Foundation: headaches.org Contact a doctor if: You get a migraine headache that is different or worse than others you have had. You have more than 15 days of headaches in one month. Get help right away if: Your migraine headache gets very bad. Your migraine headache lasts more than 72 hours. You have a fever or stiff neck. You have trouble seeing. Your muscles feel weak or like you cannot control them. You lose your balance a lot. You have trouble walking. You faint. You have a seizure. This information is not intended to replace advice given to you by your health care provider. Make sure you discuss any questions you have with your health care provider. Document Revised: 09/01/2021 Document Reviewed: 09/01/2021 Elsevier Patient Education  2024 Elsevier Inc.  

## 2022-09-23 NOTE — Assessment & Plan Note (Signed)
Chronic and intermittent and affecting quality of life Imitrex working Scheduled to see neurologist next week Clinically stable.  No concerns at present time.

## 2022-09-24 DIAGNOSIS — Z0279 Encounter for issue of other medical certificate: Secondary | ICD-10-CM

## 2022-09-28 ENCOUNTER — Encounter: Payer: Self-pay | Admitting: *Deleted

## 2022-09-29 DIAGNOSIS — G43719 Chronic migraine without aura, intractable, without status migrainosus: Secondary | ICD-10-CM | POA: Diagnosis not present

## 2022-09-30 ENCOUNTER — Encounter (INDEPENDENT_AMBULATORY_CARE_PROVIDER_SITE_OTHER): Payer: Self-pay | Admitting: Internal Medicine

## 2022-09-30 ENCOUNTER — Ambulatory Visit (INDEPENDENT_AMBULATORY_CARE_PROVIDER_SITE_OTHER): Payer: 59 | Admitting: Internal Medicine

## 2022-09-30 VITALS — BP 108/71 | HR 60 | Temp 97.7°F | Ht 67.0 in | Wt 213.0 lb

## 2022-09-30 DIAGNOSIS — R29818 Other symptoms and signs involving the nervous system: Secondary | ICD-10-CM | POA: Insufficient documentation

## 2022-09-30 DIAGNOSIS — R5383 Other fatigue: Secondary | ICD-10-CM | POA: Diagnosis not present

## 2022-09-30 DIAGNOSIS — R0602 Shortness of breath: Secondary | ICD-10-CM | POA: Diagnosis not present

## 2022-09-30 DIAGNOSIS — G8929 Other chronic pain: Secondary | ICD-10-CM

## 2022-09-30 DIAGNOSIS — Z6833 Body mass index (BMI) 33.0-33.9, adult: Secondary | ICD-10-CM

## 2022-09-30 DIAGNOSIS — R519 Headache, unspecified: Secondary | ICD-10-CM

## 2022-09-30 DIAGNOSIS — E559 Vitamin D deficiency, unspecified: Secondary | ICD-10-CM

## 2022-09-30 DIAGNOSIS — Z1331 Encounter for screening for depression: Secondary | ICD-10-CM

## 2022-09-30 DIAGNOSIS — E669 Obesity, unspecified: Secondary | ICD-10-CM

## 2022-09-30 DIAGNOSIS — R948 Abnormal results of function studies of other organs and systems: Secondary | ICD-10-CM | POA: Diagnosis not present

## 2022-09-30 NOTE — Assessment & Plan Note (Signed)
She has a mildly elevated Epworth at 9.  We will do further screening for OSA at the next office visit

## 2022-09-30 NOTE — Progress Notes (Signed)
Chief Complaint:   OBESITY Olivia Lawson (MR# [id number]) is a 37 y.o. female who presents for evaluation and treatment of obesity and related comorbidities. Current BMI is Body mass index is 33.36 kg/m. Olivia Lawson has been struggling with her weight for many years and has been unsuccessful in either losing weight, maintaining weight loss, or reaching her healthy weight goal.  Olivia Lawson is currently in the action stage of change and ready to dedicate time achieving and maintaining a healthier weight. Olivia Lawson is interested in becoming our patient and working on intensive lifestyle modifications including (but not limited to) diet and exercise for weight loss.  Destyne's habits were reviewed today and are as follows: Her family eats meals together, she thinks her family will eat healthier with her, she struggles with family and or coworkers weight loss sabotage, her desired weight loss is 53 lbs, she started gaining weight between [age] years old, her heaviest weight ever was 229 pounds, she has significant food cravings issues, she skips meals frequently, she is frequently drinking liquids with calories, she frequently makes poor food choices, she frequently eats larger portions than normal, and she struggles with emotional eating.  Depression Screen Olivia Lawson's Food and Mood (modified PHQ-9) score was 5.  Subjective:   1. Other fatigue Clarita admits to daytime somnolence and admits to waking up still tired. Patient has a history of symptoms of daytime fatigue, morning fatigue, and morning headache. Olivia Lawson generally gets 5 or 8 hours of sleep per night, and states that she has nightime awakenings. Snoring is present. Apneic episodes are present. Epworth Sleepiness Score is 9.   2. SOB (shortness of breath) on exertion Olivia Lawson notes increasing shortness of breath with exercising and seems to be worsening over time with weight gain. She notes getting out of breath sooner with activity than she used to. This has  not gotten worse recently. Olivia Lawson denies shortness of breath at rest or orthopnea.  3. Abnormal metabolism Patient has a slower than predicted metabolism. IC 1339 vs. calculated 1722. This may contribute to weight gain, chronic fatigue and difficulty losing weight.   4. Vitamin D deficiency Deficiency state associated with adiposity and may result in leptin resistance, weight gain and fatigue. Currently on vitamin D supplementation without any adverse effects.  Most recent vitamin D levels  Lab Results  Component Value Date   VD25OH 24.03 (L) 03/30/2022   VD25OH 39.0 08/03/2019   5. Suspected sleep apnea She has a mildly elevated Epworth at 9.  6. Chronic intractable headache, unspecified headache type She is followed by a neurologist.  There is discussion about starting zonisamide which I recommend is medication will help her with her headaches and weight loss goals.    Assessment/Plan:   1. Other fatigue Halona does feel that her weight is causing her energy to be lower than it should be. Fatigue may be related to obesity, depression or many other causes. Labs will be ordered, and in the meanwhile, Olivia Lawson will focus on self care including making healthy food choices, increasing physical activity and focusing on stress reduction.  - EKG 12-Lead - Vitamin B12 - Comprehensive metabolic panel  2. SOB (shortness of breath) on exertion Olivia Lawson does feel that she gets out of breath more easily that she used to when she exercises. Olivia Lawson's shortness of breath appears to be obesity related and exercise induced. She has agreed to work on weight loss and gradually increase exercise to treat her exercise induced shortness of breath. Will  continue to monitor closely.  3. Abnormal metabolism We reviewed measures to improve metabolism including not skipping meals, progressive strengthening exercises, increasing protein intake at every meal and maintaining adequate hydration and sleep. Was provided  handout on condition.  4. Vitamin D deficiency Check vitamin D levels today and adjust supplementation accordingly.  - VITAMIN D 25 Hydroxy (Vit-D Deficiency, Fractures)  5. Suspected sleep apnea We will do further screening for OSA at the next office visit.  6. Chronic intractable headache, unspecified headache type She will also be screened for OSA as she tends to wake up with headaches.  7. Depression screen Olivia Lawson had a positive depression screening. Depression is commonly associated with obesity and often results in emotional eating behaviors. We will monitor this closely and work on CBT to help improve the non-hunger eating patterns. Referral to Psychology may be required if no improvement is seen as she continues in our clinic.  8. Class 1 obesity without serious comorbidity with body mass index (BMI) of 33.0 to 33.9 in adult, unspecified obesity type We will obtain labs today.   - Hemoglobin A1c - Insulin, random - TSH - Lipid Panel With LDL/HDL Ratio  Olivia Lawson is currently in the action stage of change and her goal is to continue with weight loss efforts. I recommend Olivia Lawson begin the structured treatment plan as follows:  She has agreed to the Category 1 Plan and keeping a food journal and adhering to recommended goals of 1000 calories and 90 grams of protein daily.  Exercise goals: As is.    Behavioral modification strategies: increasing lean protein intake, decreasing simple carbohydrates, increasing vegetables, increasing water intake, decreasing liquid calories, increasing high fiber foods, no skipping meals, meal planning and cooking strategies, better snacking choices, and planning for success.  She was informed of the importance of frequent follow-up visits to maximize her success with intensive lifestyle modifications for her multiple health conditions. She was informed we would discuss her lab results at her next visit unless there is a critical issue that needs to be  addressed sooner. Olivia Lawson agreed to keep her next visit at the agreed upon time to discuss these results.  Objective:   Blood pressure 108/71, pulse 60, temperature 97.7 F (36.5 C), height 5\' 7"  (1.702 m), weight 213 lb (96.6 kg), last menstrual period 08/10/2022, SpO2 100%. Body mass index is 33.36 kg/m.  EKG: Normal sinus rhythm, rate 54 BPM.  Indirect Calorimeter completed today shows a VO2 of 194 and a REE of 1339.  Her calculated basal metabolic rate is 1610 thus her basal metabolic rate is worse than expected.  General: Cooperative, alert, well developed, in no acute distress. HEENT: Conjunctivae and lids unremarkable. Cardiovascular: Regular rhythm.  Lungs: Normal work of breathing. Neurologic: No focal deficits.   Lab Results  Component Value Date   CREATININE 0.91 09/30/2022   BUN 15 09/30/2022   NA 140 09/30/2022   K 4.2 09/30/2022   CL 103 09/30/2022   CO2 23 09/30/2022   Lab Results  Component Value Date   ALT 16 09/30/2022   AST 20 09/30/2022   GGT 16 08/17/2019   ALKPHOS 58 09/30/2022   BILITOT 0.2 09/30/2022   Lab Results  Component Value Date   HGBA1C 5.2 09/30/2022   HGBA1C 5.0 03/30/2022   HGBA1C 5.1 08/03/2019   Lab Results  Component Value Date   INSULIN 7.3 09/30/2022   Lab Results  Component Value Date   TSH 1.730 09/30/2022   Lab Results  Component  Value Date   CHOL 195 09/30/2022   HDL 75 09/30/2022   LDLCALC 106 (H) 09/30/2022   TRIG 75 09/30/2022   CHOLHDL 2 03/30/2022   Lab Results  Component Value Date   WBC 5.1 09/09/2022   HGB 13.7 09/09/2022   HCT 41.2 09/09/2022   MCV 89.8 09/09/2022   PLT 285 09/09/2022   No results found for: "IRON", "TIBC", "FERRITIN"  Attestation Statements:   Reviewed by clinician on day of visit: allergies, medications, problem list, medical history, surgical history, family history, social history, and previous encounter notes.  Time spent on visit including pre-visit chart review and  post-visit charting and care was 40 minutes.   Trude Mcburney, am acting as transcriptionist for Worthy Rancher, MD.  I have reviewed the above documentation for accuracy and completeness, and I agree with the above. -Worthy Rancher, MD

## 2022-09-30 NOTE — Assessment & Plan Note (Signed)
Patient has a slower than predicted metabolism. IC 1339 vs. calculated 1722. This may contribute to weight gain, chronic fatigue and difficulty losing weight.   We reviewed measures to improve metabolism including not skipping meals, progressive strengthening exercises, increasing protein intake at every meal and maintaining adequate hydration and sleep.   Was provided handout on condition.

## 2022-09-30 NOTE — Assessment & Plan Note (Signed)
Most recent vitamin D levels  Lab Results  Component Value Date   VD25OH 24.03 (L) 03/30/2022   VD25OH 39.0 08/03/2019     Deficiency state associated with adiposity and may result in leptin resistance, weight gain and fatigue. Currently on vitamin D supplementation without any adverse effects.  Plan: Check vitamin D levels today and adjust supplementation accordingly

## 2022-09-30 NOTE — Assessment & Plan Note (Signed)
She is followed by a neurologist.  There is discussion about starting zonisamide which I recommend is medication will help her with her headaches and weight loss goals.  She will also be screened for OSA as she tends to wake up with headaches.

## 2022-10-01 LAB — TSH: TSH: 1.73 u[IU]/mL (ref 0.450–4.500)

## 2022-10-01 LAB — COMPREHENSIVE METABOLIC PANEL
ALT: 16 IU/L (ref 0–32)
AST: 20 IU/L (ref 0–40)
Albumin: 4 g/dL (ref 3.9–4.9)
Alkaline Phosphatase: 58 IU/L (ref 44–121)
BUN/Creatinine Ratio: 16 (ref 9–23)
BUN: 15 mg/dL (ref 6–20)
Bilirubin Total: 0.2 mg/dL (ref 0.0–1.2)
CO2: 23 mmol/L (ref 20–29)
Calcium: 9.5 mg/dL (ref 8.7–10.2)
Chloride: 103 mmol/L (ref 96–106)
Creatinine, Ser: 0.91 mg/dL (ref 0.57–1.00)
Globulin, Total: 2.5 g/dL (ref 1.5–4.5)
Glucose: 78 mg/dL (ref 70–99)
Potassium: 4.2 mmol/L (ref 3.5–5.2)
Sodium: 140 mmol/L (ref 134–144)
Total Protein: 6.5 g/dL (ref 6.0–8.5)
eGFR: 83 mL/min/{1.73_m2} (ref >59)

## 2022-10-01 LAB — VITAMIN D 25 HYDROXY (VIT D DEFICIENCY, FRACTURES): Vit D, 25-Hydroxy: 31 ng/mL (ref 30.0–100.0)

## 2022-10-01 LAB — LIPID PANEL WITH LDL/HDL RATIO
Cholesterol, Total: 195 mg/dL (ref 100–199)
HDL: 75 mg/dL (ref >39)
LDL Chol Calc (NIH): 106 mg/dL — ABNORMAL HIGH (ref 0–99)
LDL/HDL Ratio: 1.4 ratio (ref 0.0–3.2)
Triglycerides: 75 mg/dL (ref 0–149)
VLDL Cholesterol Cal: 14 mg/dL (ref 5–40)

## 2022-10-01 LAB — VITAMIN B12: Vitamin B-12: 1549 pg/mL — ABNORMAL HIGH (ref 232–1245)

## 2022-10-01 LAB — HEMOGLOBIN A1C
Est. average glucose Bld gHb Est-mCnc: 103 mg/dL
Hgb A1c MFr Bld: 5.2 % (ref 4.8–5.6)

## 2022-10-01 LAB — INSULIN, RANDOM: INSULIN: 7.3 u[IU]/mL (ref 2.6–24.9)

## 2022-10-14 ENCOUNTER — Ambulatory Visit (INDEPENDENT_AMBULATORY_CARE_PROVIDER_SITE_OTHER): Payer: 59 | Admitting: Family Medicine

## 2022-10-22 ENCOUNTER — Ambulatory Visit (INDEPENDENT_AMBULATORY_CARE_PROVIDER_SITE_OTHER): Payer: 59 | Admitting: Family Medicine

## 2022-10-22 ENCOUNTER — Encounter (INDEPENDENT_AMBULATORY_CARE_PROVIDER_SITE_OTHER): Payer: Self-pay | Admitting: Family Medicine

## 2022-10-22 VITALS — BP 121/73 | HR 51 | Temp 98.6°F | Ht 67.0 in | Wt 211.0 lb

## 2022-10-22 DIAGNOSIS — E669 Obesity, unspecified: Secondary | ICD-10-CM

## 2022-10-22 DIAGNOSIS — E559 Vitamin D deficiency, unspecified: Secondary | ICD-10-CM

## 2022-10-22 DIAGNOSIS — E7849 Other hyperlipidemia: Secondary | ICD-10-CM | POA: Diagnosis not present

## 2022-10-22 DIAGNOSIS — E66811 Obesity, class 1: Secondary | ICD-10-CM

## 2022-10-22 DIAGNOSIS — Z6833 Body mass index (BMI) 33.0-33.9, adult: Secondary | ICD-10-CM | POA: Diagnosis not present

## 2022-10-22 MED ORDER — VITAMIN D (ERGOCALCIFEROL) 1.25 MG (50000 UNIT) PO CAPS: 50000.0000 [IU] | ORAL_CAPSULE | ORAL | 0 refills | Status: DC

## 2022-10-22 NOTE — Progress Notes (Signed)
Chief Complaint:   OBESITY Olivia Lawson is here to discuss her progress with her obesity treatment plan along with follow-up of her obesity related diagnoses. Olivia Lawson is on the Category 1 Plan and keeping a food journal and adhering to recommended goals of 1000 calories and 90 grams of protein and states she is following her eating plan approximately 75% of the time. Olivia Lawson states she is walking 4-8 miles 3 times per week.    Today's visit was #: 2 Starting weight: 213 lbs Starting date: 09/30/2022 Today's weight: 211 lbs Today's date: 10/22/2022 Total lbs lost to date: 2 Total lbs lost since last in-office visit: 2  Interim History: Patient here for first follow up.  She is wondering about total calorie allowance. Previously was eating 1000-1700 calories with various ranging protein intake between 60-100g/ day.  Lately her intake is 90-100g of protein and between 1000-1300 calories.  She did notice an increase in cravings with menses.  Next few weeks she has nothing really coming up except her kids games.  Subjective:   1. Vitamin D deficiency Patient's vitamin D level was 31.0, and she notes fatigue.  She is not on vitamin D.    2. Other hyperlipidemia Patient is not on medications.  Her recent LDL was 106, HDL 75, and triglycerides 75; minimally elevated.  Assessment/Plan:   1. Vitamin D deficiency Patient agreed to start prescription vitamin D 50,000 IU every 7 days with no refills.  - Vitamin D, Ergocalciferol, (DRISDOL) 1.25 MG (50000 UNIT) CAPS capsule; Take 1 capsule (50,000 Units total) by mouth every 7 (seven) days.  Dispense: 4 capsule; Refill: 0  2. Other hyperlipidemia We will repeat labs in 4 to 6 months, no change in meal plan.  3. BMI 33.0-33.9,adult  4. Obesity with starting BMI of 33.3 Olivia Lawson is currently in the action stage of change. As such, her goal is to continue with weight loss efforts. She has agreed to keeping a food journal and adhering to recommended goals  of 1100-1200 calories and 85+ grams of protein daily.   Exercise goals: All adults should avoid inactivity. Some physical activity is better than none, and adults who participate in any amount of physical activity gain some health benefits.  Behavioral modification strategies: increasing lean protein intake, meal planning and cooking strategies, keeping healthy foods in the home, and planning for success.  Olivia Lawson has agreed to follow-up with our clinic in 2 to 3 weeks. She was informed of the importance of frequent follow-up visits to maximize her success with intensive lifestyle modifications for her multiple health conditions.   Objective:   Blood pressure 121/73, pulse (!) 51, temperature 98.6 F (37 C), height 5\' 7"  (1.702 m), weight 211 lb (95.7 kg), last menstrual period 08/10/2022, SpO2 99%. Body mass index is 33.05 kg/m.  General: Cooperative, alert, well developed, in no acute distress. HEENT: Conjunctivae and lids unremarkable. Cardiovascular: Regular rhythm.  Lungs: Normal work of breathing. Neurologic: No focal deficits.   Lab Results  Component Value Date   CREATININE 0.91 09/30/2022   BUN 15 09/30/2022   NA 140 09/30/2022   K 4.2 09/30/2022   CL 103 09/30/2022   CO2 23 09/30/2022   Lab Results  Component Value Date   ALT 16 09/30/2022   AST 20 09/30/2022   GGT 16 08/17/2019   ALKPHOS 58 09/30/2022   BILITOT 0.2 09/30/2022   Lab Results  Component Value Date   HGBA1C 5.2 09/30/2022   HGBA1C 5.0 03/30/2022  HGBA1C 5.1 08/03/2019   Lab Results  Component Value Date   INSULIN 7.3 09/30/2022   Lab Results  Component Value Date   TSH 1.730 09/30/2022   Lab Results  Component Value Date   CHOL 195 09/30/2022   HDL 75 09/30/2022   LDLCALC 106 (H) 09/30/2022   TRIG 75 09/30/2022   CHOLHDL 2 03/30/2022   Lab Results  Component Value Date   VD25OH 31.0 09/30/2022   VD25OH 24.03 (L) 03/30/2022   VD25OH 39.0 08/03/2019   Lab Results  Component Value  Date   WBC 5.1 09/09/2022   HGB 13.7 09/09/2022   HCT 41.2 09/09/2022   MCV 89.8 09/09/2022   PLT 285 09/09/2022   No results found for: "IRON", "TIBC", "FERRITIN"  Attestation Statements:   Reviewed by clinician on day of visit: allergies, medications, problem list, medical history, surgical history, family history, social history, and previous encounter notes.  Time spent on visit including pre-visit chart review and post-visit care and charting was 45 minutes.   I, Burt Knack, am acting as transcriptionist for Reuben Likes, MD.  I have reviewed the above documentation for accuracy and completeness, and I agree with the above. - Reuben Likes, MD

## 2022-11-01 ENCOUNTER — Other Ambulatory Visit (INDEPENDENT_AMBULATORY_CARE_PROVIDER_SITE_OTHER): Payer: Self-pay | Admitting: Family Medicine

## 2022-11-01 DIAGNOSIS — E559 Vitamin D deficiency, unspecified: Secondary | ICD-10-CM

## 2022-11-09 ENCOUNTER — Other Ambulatory Visit (INDEPENDENT_AMBULATORY_CARE_PROVIDER_SITE_OTHER): Payer: Self-pay | Admitting: Family Medicine

## 2022-11-09 DIAGNOSIS — E559 Vitamin D deficiency, unspecified: Secondary | ICD-10-CM

## 2022-11-14 ENCOUNTER — Other Ambulatory Visit (INDEPENDENT_AMBULATORY_CARE_PROVIDER_SITE_OTHER): Payer: Self-pay | Admitting: Family Medicine

## 2022-11-14 DIAGNOSIS — E559 Vitamin D deficiency, unspecified: Secondary | ICD-10-CM

## 2022-11-16 ENCOUNTER — Ambulatory Visit (INDEPENDENT_AMBULATORY_CARE_PROVIDER_SITE_OTHER): Payer: 59 | Admitting: Family Medicine

## 2022-11-16 ENCOUNTER — Encounter (INDEPENDENT_AMBULATORY_CARE_PROVIDER_SITE_OTHER): Payer: Self-pay | Admitting: Family Medicine

## 2022-11-16 VITALS — BP 117/71 | HR 59 | Temp 98.7°F | Ht 67.0 in | Wt 209.0 lb

## 2022-11-16 DIAGNOSIS — E669 Obesity, unspecified: Secondary | ICD-10-CM

## 2022-11-16 DIAGNOSIS — Z6832 Body mass index (BMI) 32.0-32.9, adult: Secondary | ICD-10-CM | POA: Diagnosis not present

## 2022-11-16 DIAGNOSIS — Z6833 Body mass index (BMI) 33.0-33.9, adult: Secondary | ICD-10-CM

## 2022-11-16 DIAGNOSIS — E559 Vitamin D deficiency, unspecified: Secondary | ICD-10-CM

## 2022-11-16 DIAGNOSIS — E7849 Other hyperlipidemia: Secondary | ICD-10-CM

## 2022-11-16 MED ORDER — VITAMIN D (ERGOCALCIFEROL) 1.25 MG (50000 UNIT) PO CAPS
50000.0000 [IU] | ORAL_CAPSULE | ORAL | 0 refills | Status: DC
Start: 2022-11-16 — End: 2022-12-16

## 2022-11-16 NOTE — Progress Notes (Unsigned)
Chief Complaint:   OBESITY Olivia Lawson is here to discuss her progress with her obesity treatment plan along with follow-up of her obesity related diagnoses. Olivia Lawson is on keeping a food journal and adhering to recommended goals of 1100-1200 calories and 85+ grams of protein and states she is following her eating plan approximately 100% of the time. Olivia Lawson states she is walked 8 miles 1 time per week, and working out for 30-40 minutes 2 times per week.  Today's visit was #: 3 Starting weight: 213 lbs Starting date: 09/30/2022 Today's weight: 209 lbs Today's date: 11/16/2022 Total lbs lost to date: 4 Total lbs lost since last in-office visit: 2  Interim History: Patient has been monitoring calorie intake and getting 100-120g of protein a day.  She has been adding enhanced protein to the greek yogurt she is consuming.  She is eating a [place] burger and a tuna pack or two [place] burgers for her meals.  She is still eating a couple of eggs and some toast for breakfast. Denies hunger.  She is alternating between strawberries and apples.  She mentions that she has been walking twice during the weekends and exercising once during the weekdays.  Next few weeks she has her son's birthday (party at the house). For the holidays she may go to her mother in laws house.  Subjective:   1. Vitamin D deficiency Patient denies nausea, vomiting, or muscle weakness but notes fatigue.  Her last vitamin D level was of 31.0 at her initial appointment.  2. Other hyperlipidemia Patient's last LDL was 106, HDL 75, and triglycerides 75.  She is not on medications.  Assessment/Plan:   1. Vitamin D deficiency We will refill vitamin D 50,000 IU once weekly for 1 month.  - Vitamin D, Ergocalciferol, (DRISDOL) 1.25 MG (50000 UNIT) CAPS capsule; Take 1 capsule (50,000 Units total) by mouth every 7 (seven) days.  Dispense: 4 capsule; Refill: 0  2. Other hyperlipidemia Patient will continue food logging with no change in  calories or protein goals.  3. BMI 32.0-32.9,adult  4. Obesity with starting BMI of 33.3 Olivia Lawson is currently in the action stage of change. As such, her goal is to continue with weight loss efforts. She has agreed to keeping a food journal and adhering to recommended goals of 1100-1200 calories and 85+ grams of protein daily.   Exercise goals: As is.   Behavioral modification strategies: increasing lean protein intake, meal planning and cooking strategies, keeping healthy foods in the home, and planning for success.  Olivia Lawson has agreed to follow-up with our clinic in 3 weeks. She was informed of the importance of frequent follow-up visits to maximize her success with intensive lifestyle modifications for her multiple health conditions.   Objective:   Blood pressure 117/71, pulse (!) 59, temperature 98.7 F (37.1 C), height 5\' 7"  (1.702 m), weight 209 lb (94.8 kg), last menstrual period 10/18/2022, SpO2 99%. Body mass index is 32.73 kg/m.  General: Cooperative, alert, well developed, in no acute distress. HEENT: Conjunctivae and lids unremarkable. Cardiovascular: Regular rhythm.  Lungs: Normal work of breathing. Neurologic: No focal deficits.   Lab Results  Component Value Date   CREATININE 0.91 09/30/2022   BUN 15 09/30/2022   NA 140 09/30/2022   K 4.2 09/30/2022   CL 103 09/30/2022   CO2 23 09/30/2022   Lab Results  Component Value Date   ALT 16 09/30/2022   AST 20 09/30/2022   GGT 16 08/17/2019   ALKPHOS 58  09/30/2022   BILITOT 0.2 09/30/2022   Lab Results  Component Value Date   HGBA1C 5.2 09/30/2022   HGBA1C 5.0 03/30/2022   HGBA1C 5.1 08/03/2019   Lab Results  Component Value Date   INSULIN 7.3 09/30/2022   Lab Results  Component Value Date   TSH 1.730 09/30/2022   Lab Results  Component Value Date   CHOL 195 09/30/2022   HDL 75 09/30/2022   LDLCALC 106 (H) 09/30/2022   TRIG 75 09/30/2022   CHOLHDL 2 03/30/2022   Lab Results  Component Value Date    VD25OH 31.0 09/30/2022   VD25OH 24.03 (L) 03/30/2022   VD25OH 39.0 08/03/2019   Lab Results  Component Value Date   WBC 5.1 09/09/2022   HGB 13.7 09/09/2022   HCT 41.2 09/09/2022   MCV 89.8 09/09/2022   PLT 285 09/09/2022   No results found for: "IRON", "TIBC", "FERRITIN"  Attestation Statements:   Reviewed by clinician on day of visit: allergies, medications, problem list, medical history, surgical history, family history, social history, and previous encounter notes.   I, Burt Knack, am acting as transcriptionist for Reuben Likes, MD.  I have reviewed the above documentation for accuracy and completeness, and I agree with the above. - Reuben Likes, MD

## 2022-11-25 ENCOUNTER — Telehealth: Payer: Self-pay | Admitting: Neurology

## 2022-11-25 NOTE — Telephone Encounter (Signed)
Pt cancelled appointment due to already seeing someone else.

## 2022-11-26 ENCOUNTER — Ambulatory Visit: Payer: 59 | Admitting: Neurology

## 2022-12-07 DIAGNOSIS — G43719 Chronic migraine without aura, intractable, without status migrainosus: Secondary | ICD-10-CM | POA: Diagnosis not present

## 2022-12-16 ENCOUNTER — Encounter (INDEPENDENT_AMBULATORY_CARE_PROVIDER_SITE_OTHER): Payer: Self-pay | Admitting: Family Medicine

## 2022-12-16 ENCOUNTER — Ambulatory Visit (INDEPENDENT_AMBULATORY_CARE_PROVIDER_SITE_OTHER): Payer: 59 | Admitting: Family Medicine

## 2022-12-16 VITALS — BP 99/64 | HR 50 | Temp 97.9°F | Ht 67.0 in | Wt 207.0 lb

## 2022-12-16 DIAGNOSIS — E66811 Obesity, class 1: Secondary | ICD-10-CM

## 2022-12-16 DIAGNOSIS — E559 Vitamin D deficiency, unspecified: Secondary | ICD-10-CM | POA: Diagnosis not present

## 2022-12-16 DIAGNOSIS — Z6832 Body mass index (BMI) 32.0-32.9, adult: Secondary | ICD-10-CM | POA: Diagnosis not present

## 2022-12-16 DIAGNOSIS — R519 Headache, unspecified: Secondary | ICD-10-CM

## 2022-12-16 DIAGNOSIS — G8929 Other chronic pain: Secondary | ICD-10-CM

## 2022-12-16 MED ORDER — VITAMIN D (ERGOCALCIFEROL) 1.25 MG (50000 UNIT) PO CAPS
50000.0000 [IU] | ORAL_CAPSULE | ORAL | 0 refills | Status: DC
Start: 2022-12-16 — End: 2023-02-04

## 2022-12-16 NOTE — Progress Notes (Signed)
SUBJECTIVE:  Chief Complaint: Obesity  Interim History: Since last appointment she has followed plan 80-85% of time.  Mentions she hasn't been exercising as much due to work and starting her [employer].  She mentions everyday she is picking her kids up later.  She will be going to her mother in law's house for dinner for Thanksgiving.  For the month of December is planning to go out of town to see her mother for Christmas.  She will likely [employer] or [employer] to get more nutritious food in.   Olivia Lawson is here to discuss her progress with her obesity treatment plan. She is on the  1100-1200+85p  and states she is following her eating plan approximately 100 % of the time. She states she is exercising 60 minutes one time per week.   OBJECTIVE: Visit Diagnoses: Problem List Items Addressed This Visit       Other   Class 1 obesity without serious comorbidity with body mass index (BMI) of 33.0 to 33.9 in adult   Chronic headaches - Primary    Patient is noticing a more consistent cyclical nature to her headaches in relation to her menstrual cycle. She hasn't tried the preventative medication because that is a daily medication and wasn't offered abortive therapy.  We discussed referral to neurology if her headache symptoms and frequency increase to decrease her life altering headaches.      Vitamin D deficiency    Discussed importance of vitamin d supplementation.  Vitamin d supplementation has been shown to decrease fatigue, decrease risk of progression to insulin resistance and then prediabetes, decreases risk of falling in older age and can even assist in decreasing depressive symptoms in PTSD.   Refill for Vitamin D sent in.        Relevant Medications   Vitamin D, Ergocalciferol, (DRISDOL) 1.25 MG (50000 UNIT) CAPS capsule   Other Visit Diagnoses     BMI 32.0-32.9,adult           Vitals Temp: 97.9 F (36.6 C) BP: 99/64 Pulse Rate: (!) 50 SpO2: 100 %   Anthropometric  Measurements Height: 5\' 7"  (1.702 m) Weight: 207 lb (93.9 kg) BMI (Calculated): 32.41 Weight at Last Visit: 209 lb Weight Lost Since Last Visit: 2 lb Weight Gained Since Last Visit: 0 Starting Weight: 213 lb Total Weight Loss (lbs): 6 lb (2.722 kg)   Body Composition  Body Fat %: 41.5 % Fat Mass (lbs): 86.2 lbs Muscle Mass (lbs): 115.2 lbs Total Body Water (lbs): 84.4 lbs Visceral Fat Rating : 9   Other Clinical Data Fasting: no Labs: no Today's Visit #: 4 Starting Date: 09/30/22     ASSESSMENT AND PLAN:  Diet: Ziva is currently in the action stage of change. As such, her goal is to continue with weight loss efforts. She has agreed to keeping a food journal and adhering to recommended goals of 1100-1200 calories and 85 or more grams of protein daily.  Exercise: [name] has been instructed that some exercise is better than none for weight loss and overall health benefits.   Behavior Modification:  We discussed the following Behavioral Modification Strategies today: increasing lean protein intake, increasing vegetables, meal planning and cooking strategies, better snacking choices, holiday eating strategies, and planning for success.   No follow-ups on file.Marland Kitchen She was informed of the importance of frequent follow up visits to maximize her success with intensive lifestyle modifications for her multiple health conditions.  Attestation Statements:   Reviewed by Facilities manager on  day of visit: allergies, medications, problem list, medical history, surgical history, family history, social history, and previous encounter notes.   Rudean Hitt, MD

## 2022-12-16 NOTE — Assessment & Plan Note (Addendum)
Discussed importance of vitamin d supplementation.  Vitamin d supplementation has been shown to decrease fatigue, decrease risk of progression to insulin resistance and then prediabetes, decreases risk of falling in older age and can even assist in decreasing depressive symptoms in PTSD.   Refill for Vitamin D sent in.

## 2022-12-16 NOTE — Assessment & Plan Note (Signed)
Patient is noticing a more consistent cyclical nature to her headaches in relation to her menstrual cycle. She hasn't tried the preventative medication because that is a daily medication and wasn't offered abortive therapy.  We discussed referral to neurology if her headache symptoms and frequency increase to decrease her life altering headaches.

## 2023-02-04 ENCOUNTER — Ambulatory Visit (INDEPENDENT_AMBULATORY_CARE_PROVIDER_SITE_OTHER): Payer: 59 | Admitting: Family Medicine

## 2023-02-04 VITALS — BP 108/69 | HR 53 | Temp 98.8°F | Ht 67.0 in | Wt 209.0 lb

## 2023-02-04 DIAGNOSIS — Z6833 Body mass index (BMI) 33.0-33.9, adult: Secondary | ICD-10-CM

## 2023-02-04 DIAGNOSIS — E559 Vitamin D deficiency, unspecified: Secondary | ICD-10-CM

## 2023-02-04 DIAGNOSIS — E66811 Obesity, class 1: Secondary | ICD-10-CM

## 2023-02-04 DIAGNOSIS — Z6832 Body mass index (BMI) 32.0-32.9, adult: Secondary | ICD-10-CM | POA: Diagnosis not present

## 2023-02-04 DIAGNOSIS — E785 Hyperlipidemia, unspecified: Secondary | ICD-10-CM

## 2023-02-04 DIAGNOSIS — E7800 Pure hypercholesterolemia, unspecified: Secondary | ICD-10-CM

## 2023-02-04 MED ORDER — VITAMIN D (ERGOCALCIFEROL) 1.25 MG (50000 UNIT) PO CAPS
50000.0000 [IU] | ORAL_CAPSULE | ORAL | 0 refills | Status: DC
Start: 2023-02-04 — End: 2023-03-11

## 2023-02-04 NOTE — Assessment & Plan Note (Signed)
Patient last LDL 106.  HDL controlled at 75.  Not on medication and not needed.  Repeat lab in 3 month.

## 2023-02-04 NOTE — Assessment & Plan Note (Signed)
Patient doing well on Vitamin D Rx. No nausea, vomiting or muscle weakness.  Needs refill of Vit D.  Last Vitamin D level of 31 in September of last year.  Refill sent in- retest level in March of 2025 with PCP.

## 2023-02-04 NOTE — Progress Notes (Signed)
   SUBJECTIVE:  Chief Complaint: Obesity  Interim History: Patient did travel a bit over the holidays.  She felt like her menstrual cycle got thrown off and so her food intake and cravings have been different.  Patient has been trying to transition out of her job currently.  She has been more consistently on meal plan in the last couple of days.   Olivia Lawson is here to discuss her progress with her obesity treatment plan. She is on the Category 4 Plan and keeping a food journal and adhering to recommended goals of 1100-1200 calories and 85 grams of protein and states she is following her eating plan approximately 20-30 % of the time. She states she is exercising 45 minutes 1 time per week.   OBJECTIVE: Visit Diagnoses: Problem List Items Addressed This Visit       Other   Class 1 obesity without serious comorbidity with body mass index (BMI) of 33.0 to 33.9 in adult - Primary   Vitamin D deficiency   Patient doing well on Vitamin D Rx. No nausea, vomiting or muscle weakness.  Needs refill of Vit D.  Last Vitamin D level of 31 in September of last year.  Refill sent in- retest level in March of 2025 with PCP.      Relevant Medications   Vitamin D, Ergocalciferol, (DRISDOL) 1.25 MG (50000 UNIT) CAPS capsule   Hyperlipidemia   Patient last LDL 106.  HDL controlled at 75.  Not on medication and not needed.  Repeat lab in 3 month.      Other Visit Diagnoses       BMI 32.0-32.9,adult           Vitals Temp: 98.8 F (37.1 C) BP: 108/69 Pulse Rate: (!) 53 SpO2: 99 %   Anthropometric Measurements Height: 5\' 7"  (1.702 m) Weight: 209 lb (94.8 kg) BMI (Calculated): 32.73 Weight at Last Visit: 207 lb Weight Lost Since Last Visit: 0 Weight Gained Since Last Visit: 2 Starting Weight: 213 lb Total Weight Loss (lbs): 4 lb (1.814 kg)   Body Composition  Body Fat %: 40.9 % Fat Mass (lbs): 85.8 lbs Muscle Mass (lbs): 117.6 lbs Total Body Water (lbs): 83.8 lbs Visceral Fat Rating :  9   Other Clinical Data Today's Visit #: 5 Starting Date: 09/30/22     ASSESSMENT AND PLAN:  Diet: Olivia Lawson is currently in the action stage of change. As such, her goal is to continue with weight loss efforts. She has agreed to Category 4 Plan and keeping a food journal and adhering to recommended goals of 1100-1200 calories and 85 or more grams of protein daily.  Exercise: Olivia Lawson has been instructed to work up to a goal of 150 minutes of combined cardio and strengthening exercise per week for weight loss and overall health benefits.   Behavior Modification:  We discussed the following Behavioral Modification Strategies today: increasing lean protein intake, increasing vegetables, no skipping meals, meal planning and cooking strategies, better snacking choices, and avoiding temptations.  No follow-ups on file.Marland Kitchen She was informed of the importance of frequent follow up visits to maximize her success with intensive lifestyle modifications for her multiple health conditions.  Attestation Statements:   Reviewed by clinician on day of visit: allergies, medications, problem list, medical history, surgical history, family history, social history, and previous encounter notes.      Reuben Likes, MD

## 2023-03-01 DIAGNOSIS — G43719 Chronic migraine without aura, intractable, without status migrainosus: Secondary | ICD-10-CM | POA: Diagnosis not present

## 2023-03-06 ENCOUNTER — Other Ambulatory Visit (INDEPENDENT_AMBULATORY_CARE_PROVIDER_SITE_OTHER): Payer: Self-pay | Admitting: Family Medicine

## 2023-03-06 DIAGNOSIS — E559 Vitamin D deficiency, unspecified: Secondary | ICD-10-CM

## 2023-03-11 ENCOUNTER — Ambulatory Visit (INDEPENDENT_AMBULATORY_CARE_PROVIDER_SITE_OTHER): Payer: 59 | Admitting: Adult Health

## 2023-03-11 ENCOUNTER — Encounter (INDEPENDENT_AMBULATORY_CARE_PROVIDER_SITE_OTHER): Payer: Self-pay | Admitting: Adult Health

## 2023-03-11 VITALS — BP 118/77 | HR 54 | Temp 98.4°F | Ht 67.0 in | Wt 210.0 lb

## 2023-03-11 DIAGNOSIS — R519 Headache, unspecified: Secondary | ICD-10-CM

## 2023-03-11 DIAGNOSIS — E669 Obesity, unspecified: Secondary | ICD-10-CM

## 2023-03-11 DIAGNOSIS — G8929 Other chronic pain: Secondary | ICD-10-CM | POA: Diagnosis not present

## 2023-03-11 DIAGNOSIS — R5383 Other fatigue: Secondary | ICD-10-CM | POA: Diagnosis not present

## 2023-03-11 DIAGNOSIS — Z6833 Body mass index (BMI) 33.0-33.9, adult: Secondary | ICD-10-CM | POA: Diagnosis not present

## 2023-03-11 DIAGNOSIS — E7800 Pure hypercholesterolemia, unspecified: Secondary | ICD-10-CM

## 2023-03-11 DIAGNOSIS — E559 Vitamin D deficiency, unspecified: Secondary | ICD-10-CM

## 2023-03-11 MED ORDER — VITAMIN D (ERGOCALCIFEROL) 1.25 MG (50000 UNIT) PO CAPS: 50000.0000 [IU] | ORAL_CAPSULE | ORAL | 0 refills | Status: DC

## 2023-03-11 NOTE — Progress Notes (Signed)
WEIGHT SUMMARY AND BIOMETRICS  Vitals Temp: 98.4 F (36.9 C) BP: 118/77 Pulse Rate: (!) 54 SpO2: 99 %   Anthropometric Measurements Height: 5\' 7"  (1.702 m) Weight: 210 lb (95.3 kg) BMI (Calculated): 32.88 Weight at Last Visit: 209 lb Weight Lost Since Last Visit: 0 lb Weight Gained Since Last Visit: 1 lb Starting Weight: 213 lb Total Weight Loss (lbs): 3 lb (1.361 kg)   Body Composition  Body Fat %: 42 % Fat Mass (lbs): 88.4 lbs Muscle Mass (lbs): 116 lbs Total Body Water (lbs): 85.2 lbs Visceral Fat Rating : 9   Other Clinical Data Fasting: Yes Labs: Yes Today's Visit #: 6 Starting Date: 09/30/22    Chief Complaint:   OBESITY Olivia Lawson is here to discuss her progress with her obesity treatment plan.  She is on the keeping a food journal and adhering to recommended goals of 1100-1200 calories and 85+ protein and states she is following her eating plan approximately 0 % of the time.  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 She endorses profound stress r/t to her job. She is in [facility] program via [employer]  Hydration-she drinks half her body weight in oz water per day.  The last month she has not been following any prescribed meal plan.  Subjective:   1. Chronic intractable headache, unspecified headache type Diet modifications for the next 6-9 months She was previously prescribed Topamax for prevention and Imitrex for acute tx. She never used either medications. She prefers to avoid Rx therapy. Discussed Nerivio Device- information provided to pt.  2. Other fatigue She endorses increased energy levels since starting weekly Ergocalciferol  3. Vitamin D deficiency She is on weekly Ergocalciferol- denies N/V/Muscle Weakness  4. Pure hypercholesterolemia Lipid Panel     Component Value Date/Time   CHOL 195 09/30/2022  0914   TRIG 75 09/30/2022 0914   HDL 75 09/30/2022 0914   CHOLHDL 2 03/30/2022 1637   VLDL 10.4 03/30/2022 1637   LDLCALC 106 (H) 09/30/2022 0914   LABVLDL 14 09/30/2022 0914    She is not on statin therapy.  Assessment/Plan:   1. Chronic intractable headache, unspecified headache type Remain well hydrated Discussed Nervisio Device with her established Neurologist  2. Other fatigue Remain well hydrated with water Increase regular exercise  3. Vitamin D deficiency Refill - Vitamin D, Ergocalciferol, (DRISDOL) 1.25 MG (50000 UNIT) CAPS capsule; Take 1 capsule (50,000 Units total) by mouth every 7 (seven) days.  Dispense: 4 capsule; Refill: 0 Check Labs Q2-49M  4. Pure hypercholesterolemia Reduce sat fat Increase cardiovascular exercise  5. BMI 33.0-33.9,adult, Current BMI 33.0 (Primary)  Olivia Lawson is not currently in the action stage of change. As such, her goal is to get back to weightloss efforts . She has agreed to following a lower carbohydrate, vegetable and lean protein rich diet plan.   Exercise goals: All adults should avoid inactivity. Some physical activity is better than none, and adults who participate in any amount of physical activity gain some health benefits. Adults should also include muscle-strengthening activities that involve all major muscle groups on 2 or more days a week.  Behavioral modification strategies: increasing lean protein intake, decreasing simple carbohydrates, increasing vegetables, increasing water intake, no skipping meals, meal planning and cooking strategies, keeping healthy foods in the home, ways to avoid boredom eating, and planning for success.  Olivia Lawson has agreed  to follow-up with our clinic in 4 weeks. She was informed of the importance of frequent follow-up visits to maximize her success with intensive lifestyle modifications for her multiple health conditions.   Objective:   Blood pressure 118/77, pulse (!) 54, temperature 98.4 F (36.9  C), height 5\' 7"  (1.702 m), weight 210 lb (95.3 kg), last menstrual period 03/04/2023, SpO2 99%. Body mass index is 32.89 kg/m.  General: Cooperative, alert, well developed, in no acute distress. HEENT: Conjunctivae and lids unremarkable. Cardiovascular: Regular rhythm.  Lungs: Normal work of breathing. Neurologic: No focal deficits.   Lab Results  Component Value Date   CREATININE 0.91 09/30/2022   BUN 15 09/30/2022   NA 140 09/30/2022   K 4.2 09/30/2022   CL 103 09/30/2022   CO2 23 09/30/2022   Lab Results  Component Value Date   ALT 16 09/30/2022   AST 20 09/30/2022   GGT 16 08/17/2019   ALKPHOS 58 09/30/2022   BILITOT 0.2 09/30/2022   Lab Results  Component Value Date   HGBA1C 5.2 09/30/2022   HGBA1C 5.0 03/30/2022   HGBA1C 5.1 08/03/2019   Lab Results  Component Value Date   INSULIN 7.3 09/30/2022   Lab Results  Component Value Date   TSH 1.730 09/30/2022   Lab Results  Component Value Date   CHOL 195 09/30/2022   HDL 75 09/30/2022   LDLCALC 106 (H) 09/30/2022   TRIG 75 09/30/2022   CHOLHDL 2 03/30/2022   Lab Results  Component Value Date   VD25OH 31.0 09/30/2022   VD25OH 24.03 (L) 03/30/2022   VD25OH 39.0 08/03/2019   Lab Results  Component Value Date   WBC 5.1 09/09/2022   HGB 13.7 09/09/2022   HCT 41.2 09/09/2022   MCV 89.8 09/09/2022   PLT 285 09/09/2022   No results found for: "IRON", "TIBC", "FERRITIN"  Attestation Statements:   Reviewed by clinician on day of visit: allergies, medications, problem list, medical history, surgical history, family history, social history, and previous encounter notes.  I have reviewed the above documentation for accuracy and completeness, and I agree with the above. -  Annye Forrey d. Nikolaj Geraghty, NP-C

## 2023-03-24 ENCOUNTER — Encounter: Admitting: Emergency Medicine

## 2023-03-31 ENCOUNTER — Encounter (INDEPENDENT_AMBULATORY_CARE_PROVIDER_SITE_OTHER): Payer: Self-pay

## 2023-03-31 ENCOUNTER — Telehealth (INDEPENDENT_AMBULATORY_CARE_PROVIDER_SITE_OTHER): Payer: Self-pay | Admitting: Adult Health

## 2023-03-31 NOTE — Telephone Encounter (Signed)
 The patient reports Olivia Lawson wanted her to get some lab paperwork from her PCP but she does not remember what labs were asked for. Please follow up with the patient.

## 2023-04-02 ENCOUNTER — Other Ambulatory Visit (INDEPENDENT_AMBULATORY_CARE_PROVIDER_SITE_OTHER): Payer: Self-pay | Admitting: Adult Health

## 2023-04-02 DIAGNOSIS — E559 Vitamin D deficiency, unspecified: Secondary | ICD-10-CM

## 2023-04-07 ENCOUNTER — Encounter: Payer: Self-pay | Admitting: Emergency Medicine

## 2023-04-07 ENCOUNTER — Ambulatory Visit: Admitting: Emergency Medicine

## 2023-04-07 VITALS — BP 120/78 | HR 60 | Temp 99.1°F | Ht 67.0 in | Wt 207.0 lb

## 2023-04-07 DIAGNOSIS — Z13220 Encounter for screening for lipoid disorders: Secondary | ICD-10-CM

## 2023-04-07 DIAGNOSIS — Z1329 Encounter for screening for other suspected endocrine disorder: Secondary | ICD-10-CM | POA: Diagnosis not present

## 2023-04-07 DIAGNOSIS — Z0000 Encounter for general adult medical examination without abnormal findings: Secondary | ICD-10-CM

## 2023-04-07 DIAGNOSIS — Z13228 Encounter for screening for other metabolic disorders: Secondary | ICD-10-CM | POA: Diagnosis not present

## 2023-04-07 DIAGNOSIS — Z130 Encounter for screening for diseases of the blood and blood-forming organs and certain disorders involving the immune mechanism: Secondary | ICD-10-CM

## 2023-04-07 NOTE — Patient Instructions (Signed)

## 2023-04-07 NOTE — Progress Notes (Signed)
 Olivia Lawson 38 y.o.   Chief Complaint  Patient presents with   Annual Exam    Patient states she is needing labs and her vitamin D level checked for her weight wellness clinic. No other concerns     HISTORY OF PRESENT ILLNESS: This is a 38 y.o. female here for annual exam. Doing well.  Has no complaints or medical concerns today.  HPI   Prior to Admission medications   Medication Sig Start Date End Date Taking? Authorizing Provider  ketoconazole (NIZORAL) 2 % shampoo Apply topically. 06/30/21  Yes [provider]  Vitamin D, Ergocalciferol, (DRISDOL) 1.25 MG (50000 UNIT) CAPS capsule Take 1 capsule (50,000 Units total) by mouth every 7 (seven) days. 03/11/23  Yes Danford, Katy D, NP  ibuprofen (ADVIL) 200 MG tablet Take 200 mg by mouth as needed. Patient not taking: Reported on 04/07/2023    [provider]    Allergies  Allergen Reactions   Influenza A (H1n1) Monovalent Vaccine Swelling   Tioconazole Other (See Comments)   Monistat [Miconazole] Swelling and Rash    Ampule, 1 day insert.     Patient Active Problem List   Diagnosis Date Noted   Hyperlipidemia 02/04/2023   Suspected sleep apnea 09/30/2022   Vitamin D deficiency 09/30/2022   Abnormal metabolism 09/30/2022   Depression screen 09/30/2022   Intractable chronic migraine without aura and without status migrainosus 08/27/2022   Chronic migraine without aura without status migrainosus, not intractable 06/23/2022   Chronic pain of left knee 06/23/2022   Chronic headaches 11/28/2017   Class 1 obesity without serious comorbidity with body mass index (BMI) of 33.0 to 33.9 in adult 05/10/2008    Past Medical History:  Diagnosis Date   Back pain    Constipation    Joint pain    Migraine    Palpitations    Placental abruption    Vitamin D deficiency     Past Surgical History:  Procedure Laterality Date   CESAREAN SECTION     DENTAL SURGERY     HERNIA REPAIR      Social History    Socioeconomic History   Marital status: Married    Spouse name: Iantha Fallen   Number of children: Not on file   Years of education: Not on file   Highest education level: Not on file  Occupational History   Occupation: Case worker, CMA, Consulting civil engineer  Tobacco Use   Smoking status: Never   Smokeless tobacco: Never  Substance and Sexual Activity   Alcohol use: No   Drug use: No   Sexual activity: Yes    Birth control/protection: None  Other Topics Concern   Not on file  Social History Narrative   Not on file   Social Drivers of Health   Financial Resource Strain: Not on file  Food Insecurity: Not on file  Transportation Needs: Not on file  Physical Activity: Not on file  Stress: Not on file  Social Connections: Unknown (01/10/2022)   Received [facility]   Social Network    Social Network: Not on file  Intimate Partner Violence: Unknown (01/10/2022)   Received from [facility]   HITS    Physically Hurt: Not on file    Insult or Talk Down To: Not on file    Threaten Physical Harm: Not on file    Scream or Curse: Not on file    Family History  Problem Relation Age of Onset   High blood  pressure Mother    High Cholesterol Mother    Obesity Mother    Alcoholism Father      Review of Systems  Constitutional: Negative.  Negative for chills and fever.  HENT: Negative.  Negative for congestion and sore throat.   Respiratory: Negative.  Negative for cough and shortness of breath.   Cardiovascular: Negative.  Negative for chest pain and palpitations.  Gastrointestinal:  Negative for abdominal pain, diarrhea, nausea and vomiting.  Genitourinary: Negative.  Negative for dysuria and hematuria.  Skin: Negative.  Negative for rash.  Neurological: Negative.  Negative for dizziness and headaches.  All other systems reviewed and are negative.   Vitals:   04/07/23 1549  BP: 120/78  Pulse: 60  Temp: 99.1 F (37.3 C)  SpO2: 99%     Physical Exam Vitals reviewed.  Constitutional:      Appearance: Normal appearance.  HENT:     Head: Normocephalic.     Right Ear: Tympanic membrane, ear canal and external ear normal.     Left Ear: Tympanic membrane, ear canal and external ear normal.     Mouth/Throat:     Mouth: Mucous membranes are moist.     Pharynx: Oropharynx is clear.  Eyes:     Extraocular Movements: Extraocular movements intact.     Conjunctiva/sclera: Conjunctivae normal.     Pupils: Pupils are equal, round, and reactive to light.  Cardiovascular:     Rate and Rhythm: Normal rate and regular rhythm.     Pulses: Normal pulses.     Heart sounds: Normal heart sounds.  Pulmonary:     Effort: Pulmonary effort is normal.     Breath sounds: Normal breath sounds.  Abdominal:     Palpations: Abdomen is soft.     Tenderness: There is no abdominal tenderness.  Musculoskeletal:     Cervical back: No tenderness.  Lymphadenopathy:     Cervical: No cervical adenopathy.  Skin:    General: Skin is warm and dry.     Capillary Refill: Capillary refill takes less than 2 seconds.  Neurological:     General: No focal deficit present.     Mental Status: She is alert and oriented to person, place, and time.  Psychiatric:        Mood and Affect: Mood normal.        Behavior: Behavior normal.      ASSESSMENT & PLAN: Problem List Items Addressed This Visit   None Visit Diagnoses       Routine general medical examination at a health care facility    -  Primary   Relevant Orders   VITAMIN D 25 Hydroxy (Vit-D Deficiency, Fractures)   CBC with Differential/Platelet   Comprehensive metabolic panel   Hemoglobin A1c   TSH   Vitamin B12   Lipid panel     Screening for deficiency anemia       Relevant Orders   CBC with Differential/Platelet     Screening for lipoid disorders       Relevant Orders   Lipid panel     Screening for endocrine, metabolic and immunity disorder       Relevant Orders   VITAMIN D 25  Hydroxy (Vit-D Deficiency, Fractures)   Comprehensive metabolic panel   Hemoglobin A1c   TSH   Vitamin B12     Modifiable risk factors discussed with patient. Anticipatory guidance according to age provided. The following topics were also discussed: Social Determinants of Health Smoking.  Non-smoker Diet  and nutrition Benefits of exercise Cancer family history review Vaccinations review and recommendations Cardiovascular risk assessment Mental health including depression and anxiety Fall and accident prevention  Patient Instructions  Health Maintenance, Female Adopting a healthy lifestyle and getting preventive care are important in promoting health and wellness. Ask your health care provider about: The right schedule for you to have regular tests and exams. Things you can do on your own to prevent diseases and keep yourself healthy. What should I know about diet, weight, and exercise? Eat a healthy diet  Eat a diet that includes plenty of vegetables, fruits, low-fat dairy products, and lean protein. Do not eat a lot of foods that are high in solid fats, added sugars, or sodium. Maintain a healthy weight Body mass index (BMI) is used to identify weight problems. It estimates body fat based on height and weight. Your health care provider can help determine your BMI and help you achieve or maintain a healthy weight. Get regular exercise Get regular exercise. This is one of the most important things you can do for your health. Most adults should: Exercise for at least 150 minutes each week. The exercise should increase your heart rate and make you sweat (moderate-intensity exercise). Do strengthening exercises at least twice a week. This is in addition to the moderate-intensity exercise. Spend less time sitting. Even light physical activity can be beneficial. Watch cholesterol and blood lipids Have your blood tested for lipids and cholesterol at 39 years of age, then have this test  every 5 years. Have your cholesterol levels checked more often if: Your lipid or cholesterol levels are high. You are older than 39 years of age. You are at high risk for heart disease. What should I know about cancer screening? Depending on your health history and family history, you may need to have cancer screening at various ages. This may include screening for: Breast cancer. Cervical cancer. Colorectal cancer. Skin cancer. Lung cancer. What should I know about heart disease, diabetes, and high blood pressure? Blood pressure and heart disease High blood pressure causes heart disease and increases the risk of stroke. This is more likely to develop in people who have high blood pressure readings or are overweight. Have your blood pressure checked: Every 3-5 years if you are 62-61 years of age. Every year if you are 52 years old or older. Diabetes Have regular diabetes screenings. This checks your fasting blood sugar level. Have the screening done: Once every three years after age 110 if you are at a normal weight and have a low risk for diabetes. More often and at a younger age if you are overweight or have a high risk for diabetes. What should I know about preventing infection? Hepatitis B If you have a higher risk for hepatitis B, you should be screened for this virus. Talk with your health care provider to find out if you are at risk for hepatitis B infection. Hepatitis C Testing is recommended for: Everyone born from [birth year] through 1966. Anyone with known risk factors for hepatitis C. Sexually transmitted infections (STIs) Get screened for STIs, including gonorrhea and chlamydia, if: You are sexually active and are younger than 39 years of age. You are older than 39 years of age and your health care provider tells you that you are at risk for this type of infection. Your sexual activity has changed since you were last screened, and you are at increased risk for chlamydia or  gonorrhea. Ask your health care provider if  you are at risk. Ask your health care provider about whether you are at high risk for HIV. Your health care provider may recommend a prescription medicine to help prevent HIV infection. If you choose to take medicine to prevent HIV, you should first get tested for HIV. You should then be tested every 3 months for as long as you are taking the medicine. Pregnancy If you are about to stop having your period (premenopausal) and you may become pregnant, seek counseling before you get pregnant. Take 400 to 800 micrograms (mcg) of folic acid every day if you become pregnant. Ask for birth control (contraception) if you want to prevent pregnancy. Osteoporosis and menopause Osteoporosis is a disease in which the bones lose minerals and strength with aging. This can result in bone fractures. If you are 89 years old or older, or if you are at risk for osteoporosis and fractures, ask your health care provider if you should: Be screened for bone loss. Take a calcium or vitamin D supplement to lower your risk of fractures. Be given hormone replacement therapy (HRT) to treat symptoms of menopause. Follow these instructions at home: Alcohol use Do not drink alcohol if: Your health care provider tells you not to drink. You are pregnant, may be pregnant, or are planning to become pregnant. If you drink alcohol: Limit how much you have to: 0-1 drink a day. Know how much alcohol is in your drink. In the [place] one drink equals one 12 oz bottle of beer (355 mL), one 5 oz glass of wine (148 mL), or one 1 oz glass of hard liquor (44 mL). Lifestyle Do not use any products that contain nicotine or tobacco. These products include cigarettes, chewing tobacco, and vaping devices, such as e-cigarettes. If you need help quitting, ask your health care provider. Do not use street drugs. Do not share needles. Ask your health care provider for help if you need support or  information about quitting drugs. General instructions Schedule regular health, dental, and eye exams. Stay current with your vaccines. Tell your health care provider if: You often feel depressed. You have ever been abused or do not feel safe at home. Summary Adopting a healthy lifestyle and getting preventive care are important in promoting health and wellness. Follow your health care provider's instructions about healthy diet, exercising, and getting tested or screened for diseases. Follow your health care provider's instructions on monitoring your cholesterol and blood pressure. This information is not intended to replace advice given to you by your health care provider. Make sure you discuss any questions you have with your health care provider. Document Revised: 05/27/2020 Document Reviewed: 05/27/2020 Elsevier Patient Education  2024 [employer]     Edwina Barth, MD [facility]

## 2023-04-08 ENCOUNTER — Encounter: Payer: Self-pay | Admitting: Emergency Medicine

## 2023-04-08 LAB — LIPID PANEL
Cholesterol: 248 mg/dL — ABNORMAL HIGH (ref 0–200)
HDL: 63.9 mg/dL (ref >39.00)
LDL Cholesterol: 171 mg/dL — ABNORMAL HIGH (ref 0–99)
NonHDL: 183.9
Total CHOL/HDL Ratio: 4
Triglycerides: 67 mg/dL (ref 0.0–149.0)
VLDL: 13.4 mg/dL (ref 0.0–40.0)

## 2023-04-08 LAB — COMPREHENSIVE METABOLIC PANEL
ALT: 10 U/L (ref 0–35)
AST: 17 U/L (ref 0–37)
Albumin: 3.9 g/dL (ref 3.5–5.2)
Alkaline Phosphatase: 44 U/L (ref 39–117)
BUN: 15 mg/dL (ref 6–23)
CO2: 26 meq/L (ref 19–32)
Calcium: 9.4 mg/dL (ref 8.4–10.5)
Chloride: 104 meq/L (ref 96–112)
Creatinine, Ser: 0.91 mg/dL (ref 0.40–1.20)
GFR: 79.97 mL/min (ref >60.00)
Glucose, Bld: 85 mg/dL (ref 70–99)
Potassium: 4.1 meq/L (ref 3.5–5.1)
Sodium: 136 meq/L (ref 135–145)
Total Bilirubin: 0.5 mg/dL (ref 0.2–1.2)
Total Protein: 6.9 g/dL (ref 6.0–8.3)

## 2023-04-08 LAB — CBC WITH DIFFERENTIAL/PLATELET
Basophils Absolute: 0 10*3/uL (ref 0.0–0.1)
Basophils Relative: 0.8 % (ref 0.0–3.0)
Eosinophils Absolute: 0.2 10*3/uL (ref 0.0–0.7)
Eosinophils Relative: 3.8 % (ref 0.0–5.0)
HCT: 40.6 % (ref 36.0–46.0)
Hemoglobin: 13.5 g/dL (ref 12.0–15.0)
Lymphocytes Relative: 41.7 % (ref 12.0–46.0)
Lymphs Abs: 2 10*3/uL (ref 0.7–4.0)
MCHC: 33.3 g/dL (ref 30.0–36.0)
MCV: 91.2 fl (ref 78.0–100.0)
Monocytes Absolute: 0.6 10*3/uL (ref 0.1–1.0)
Monocytes Relative: 11.7 % (ref 3.0–12.0)
Neutro Abs: 2 10*3/uL (ref 1.4–7.7)
Neutrophils Relative %: 42 % — ABNORMAL LOW (ref 43.0–77.0)
Platelets: 285 10*3/uL (ref 150.0–400.0)
RBC: 4.45 Mil/uL (ref 3.87–5.11)
RDW: 13.8 % (ref 11.5–15.5)
WBC: 4.8 10*3/uL (ref 4.0–10.5)

## 2023-04-08 LAB — HEMOGLOBIN A1C: Hgb A1c MFr Bld: 5 % (ref 4.6–6.5)

## 2023-04-08 LAB — VITAMIN B12: Vitamin B-12: 901 pg/mL (ref 211–911)

## 2023-04-08 LAB — TSH: TSH: 1.18 u[IU]/mL (ref 0.35–5.50)

## 2023-04-08 LAB — VITAMIN D 25 HYDROXY (VIT D DEFICIENCY, FRACTURES): VITD: 34.94 ng/mL (ref 30.00–100.00)

## 2023-04-14 ENCOUNTER — Encounter (INDEPENDENT_AMBULATORY_CARE_PROVIDER_SITE_OTHER): Payer: Self-pay | Admitting: Adult Health

## 2023-04-14 ENCOUNTER — Ambulatory Visit (INDEPENDENT_AMBULATORY_CARE_PROVIDER_SITE_OTHER): Payer: 59 | Admitting: Adult Health

## 2023-04-14 VITALS — BP 105/72 | HR 55 | Temp 98.3°F | Ht 67.0 in | Wt 201.0 lb

## 2023-04-14 DIAGNOSIS — E669 Obesity, unspecified: Secondary | ICD-10-CM

## 2023-04-14 DIAGNOSIS — Z6833 Body mass index (BMI) 33.0-33.9, adult: Secondary | ICD-10-CM

## 2023-04-14 DIAGNOSIS — Z6831 Body mass index (BMI) 31.0-31.9, adult: Secondary | ICD-10-CM | POA: Diagnosis not present

## 2023-04-14 DIAGNOSIS — R948 Abnormal results of function studies of other organs and systems: Secondary | ICD-10-CM

## 2023-04-14 DIAGNOSIS — E782 Mixed hyperlipidemia: Secondary | ICD-10-CM

## 2023-04-14 DIAGNOSIS — E559 Vitamin D deficiency, unspecified: Secondary | ICD-10-CM | POA: Diagnosis not present

## 2023-04-14 DIAGNOSIS — Z0000 Encounter for general adult medical examination without abnormal findings: Secondary | ICD-10-CM

## 2023-04-14 MED ORDER — VITAMIN D (ERGOCALCIFEROL) 1.25 MG (50000 UNIT) PO CAPS
50000.0000 [IU] | ORAL_CAPSULE | ORAL | 0 refills | Status: DC
Start: 2023-04-14 — End: 2023-08-17

## 2023-04-14 NOTE — Progress Notes (Signed)
 WEIGHT SUMMARY AND BIOMETRICS  Vitals Temp: 98.3 F (36.8 C) BP: 105/72 Pulse Rate: (!) 55 SpO2: 100 %   Anthropometric Measurements Height: 5\' 7"  (1.702 m) Weight: 201 lb (91.2 kg) BMI (Calculated): 31.47 Weight at Last Visit: 210 lb Weight Lost Since Last Visit: 9 Weight Gained Since Last Visit: 0 Starting Weight: 213 lb Total Weight Loss (lbs): 12 lb (5.443 kg)   Body Composition  Body Fat %: 40.5 % Fat Mass (lbs): 81.6 lbs Muscle Mass (lbs): 113.8 lbs Total Body Water (lbs): 84.4 lbs Visceral Fat Rating : 8   Other Clinical Data Fasting: no Labs: no Starting Date: 09/30/22    Chief Complaint:   OBESITY Yardley is here to discuss her progress with her obesity treatment plan.  She is on the keeping a food journal and adhering to recommended goals of 1100-1200 calories and 85g+ protein and states she is following her eating plan approximately 75 % of the time.  She states she is exercising: NEAT Activities   Interim History:  Ms. 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 She endorses profound stress r/t to her job. She is in [facility] program via [facility]  She will begin [employer] begins mid April 2025, 8 months in duration.  She and her husband share three sons, ages 16, 54, 58 The boys are quite active in school and organized sports  She has wellness visit with fasting labs on 04/07/2023- PCP/Dr. Alvy Bimler  Reviewed Labs with pt today  Hydration-she continues to drink half her body weight in ounces of water per day  Subjective:   1. Healthcare maintenance Discussed Labs  Current Exercise- Full form push ups, she can complete 8 currently  She has a worked as a Systems analyst and in past and reports it take 2 weeks to increase overall fitness levels. She denies tobacco/vape use  2. Mixed hyperlipidemia Discussed Labs Lipid Panel      Component Value Date/Time   CHOL 248 (H) 04/07/2023 1647   CHOL 195 09/30/2022 0914   TRIG 67.0 04/07/2023 1647   HDL 63.90 04/07/2023 1647   HDL 75 09/30/2022 0914   CHOLHDL 4 04/07/2023 1647   VLDL 13.4 04/07/2023 1647   LDLCALC 171 (H) 04/07/2023 1647   LDLCALC 106 (H) 09/30/2022 0914   LABVLDL 14 09/30/2022 0914    Total and LDL levels both worsened and above goal She is not on statin therapy. She reports increased red meat intake Winter 2024-2025  3. Abnormal metabolism Discussed Labs  09/30/22 07:00  RMR 1339   Anticipated metabolism is 1668 Her metabolism is slower than expected.  4. Vitamin D deficiency Discussed Labs  Latest Reference Range & Units 04/07/23 16:47  VITD 30.00 - 100.00 ng/mL 34.94  Vitamin B12 211 - 911 pg/mL 901   Vit D Level low normal, below goal of 50-0 She is on weekly Ergocalciferol- denies N/V/Muscle Weakness  Assessment/Plan:   1. Healthcare maintenance (Primary) Increase regular exercise  2. Mixed hyperlipidemia Reduce sat fat intake Increase regular exercise  3. Abnormal metabolism Increase regular exercise Continue to increase lean protein intake  4. Vitamin D deficiency Refill - Vitamin D, Ergocalciferol, (DRISDOL) 1.25 MG (50000 UNIT) CAPS capsule; Take 1 capsule (50,000 Units total) by mouth every 7 (seven) days.  Dispense: 4 capsule; Refill: 0  5. BMI 33.0-33.9,adult, Current BMI 31.5  Leimomi is currently in the action stage of change. As such, her  goal is to continue with weight loss efforts. She has agreed to keeping a food journal and adhering to recommended goals of 1100-1200 calories and 85g+ protein.   Exercise goals: For substantial health benefits, adults should do at least 150 minutes (2 hours and 30 minutes) a week of moderate-intensity, or 75 minutes (1 hour and 15 minutes) a week of vigorous-intensity aerobic physical activity, or an equivalent combination of moderate- and vigorous-intensity aerobic activity.  Aerobic activity should be performed in episodes of at least 10 minutes, and preferably, it should be spread throughout the week.  Behavioral modification strategies: increasing lean protein intake, decreasing simple carbohydrates, increasing vegetables, increasing water intake, no skipping meals, meal planning and cooking strategies, keeping healthy foods in the home, ways to avoid boredom eating, and planning for success.  Kaveri has agreed to follow-up with our clinic in 4 weeks. She was informed of the importance of frequent follow-up visits to maximize her success with intensive lifestyle modifications for her multiple health conditions.   Objective:   Blood pressure 105/72, pulse (!) 55, temperature 98.3 F (36.8 C), height 5\' 7"  (1.702 m), weight 201 lb (91.2 kg), last menstrual period 04/09/2023, SpO2 100%. Body mass index is 31.48 kg/m.  General: Cooperative, alert, well developed, in no acute distress. HEENT: Conjunctivae and lids unremarkable. Cardiovascular: Regular rhythm.  Lungs: Normal work of breathing. Neurologic: No focal deficits.   Lab Results  Component Value Date   CREATININE 0.91 04/07/2023   BUN 15 04/07/2023   NA 136 04/07/2023   K 4.1 04/07/2023   CL 104 04/07/2023   CO2 26 04/07/2023   Lab Results  Component Value Date   ALT 10 04/07/2023   AST 17 04/07/2023   GGT 16 08/17/2019   ALKPHOS 44 04/07/2023   BILITOT 0.5 04/07/2023   Lab Results  Component Value Date   HGBA1C 5.0 04/07/2023   HGBA1C 5.2 09/30/2022   HGBA1C 5.0 03/30/2022   HGBA1C 5.1 08/03/2019   Lab Results  Component Value Date   INSULIN 7.3 09/30/2022   Lab Results  Component Value Date   TSH 1.18 04/07/2023   Lab Results  Component Value Date   CHOL 248 (H) 04/07/2023   HDL 63.90 04/07/2023   LDLCALC 171 (H) 04/07/2023   TRIG 67.0 04/07/2023   CHOLHDL 4 04/07/2023   Lab Results  Component Value Date   VD25OH 34.94 04/07/2023   VD25OH 31.0 09/30/2022   VD25OH 24.03  (L) 03/30/2022   Lab Results  Component Value Date   WBC 4.8 04/07/2023   HGB 13.5 04/07/2023   HCT 40.6 04/07/2023   MCV 91.2 04/07/2023   PLT 285.0 04/07/2023   No results found for: "IRON", "TIBC", "FERRITIN"  Attestation Statements:   Reviewed by clinician on day of visit: allergies, medications, problem list, medical history, surgical history, family history, social history, and previous encounter notes.  I have reviewed the above documentation for accuracy and completeness, and I agree with the above. -  Davian Hanshaw d. Kenlynn Houde, NP-C

## 2023-05-08 ENCOUNTER — Other Ambulatory Visit (INDEPENDENT_AMBULATORY_CARE_PROVIDER_SITE_OTHER): Payer: Self-pay | Admitting: Adult Health

## 2023-05-08 DIAGNOSIS — E559 Vitamin D deficiency, unspecified: Secondary | ICD-10-CM

## 2023-05-25 ENCOUNTER — Ambulatory Visit (INDEPENDENT_AMBULATORY_CARE_PROVIDER_SITE_OTHER): Admitting: Adult Health

## 2023-05-27 ENCOUNTER — Other Ambulatory Visit (INDEPENDENT_AMBULATORY_CARE_PROVIDER_SITE_OTHER): Payer: Self-pay | Admitting: Family Medicine

## 2023-05-27 ENCOUNTER — Other Ambulatory Visit (INDEPENDENT_AMBULATORY_CARE_PROVIDER_SITE_OTHER): Payer: Self-pay | Admitting: Adult Health

## 2023-05-27 DIAGNOSIS — E559 Vitamin D deficiency, unspecified: Secondary | ICD-10-CM

## 2023-07-04 ENCOUNTER — Other Ambulatory Visit: Payer: Self-pay

## 2023-07-04 ENCOUNTER — Encounter (HOSPITAL_COMMUNITY): Payer: Self-pay

## 2023-07-04 ENCOUNTER — Emergency Department (HOSPITAL_COMMUNITY)

## 2023-07-04 ENCOUNTER — Emergency Department (HOSPITAL_COMMUNITY)
Admission: EM | Admit: 2023-07-04 | Discharge: 2023-07-04 | Disposition: A | Discharge status: Home / Self Care | Attending: Emergency Medicine | Admitting: Emergency Medicine

## 2023-07-04 DIAGNOSIS — M25562 Pain in left knee: Secondary | ICD-10-CM | POA: Insufficient documentation

## 2023-07-04 MED ORDER — OXYCODONE-ACETAMINOPHEN 7.5-325 MG PO TABS: 1.0000 | ORAL_TABLET | ORAL | 0 refills | Status: DC | PRN

## 2023-07-04 MED ORDER — MORPHINE SULFATE (PF) 4 MG/ML IV SOLN
6.0000 mg | Freq: Once | INTRAVENOUS | Status: AC
  Administered 2023-07-04: 6 mg via SUBCUTANEOUS
  Filled 2023-07-04: qty 2

## 2023-07-04 MED ORDER — HYDROMORPHONE HCL 1 MG/ML IJ SOLN
1.0000 mg | Freq: Once | INTRAMUSCULAR | Status: AC
  Administered 2023-07-04: 1 mg via INTRAMUSCULAR
  Filled 2023-07-04: qty 1

## 2023-07-04 MED ORDER — OXYCODONE-ACETAMINOPHEN 7.5-325 MG PO TABS: 1.0000 | ORAL_TABLET | ORAL | 0 refills | Status: AC | PRN

## 2023-07-04 MED ORDER — OXYCODONE-ACETAMINOPHEN 5-325 MG PO TABS
1.0000 | ORAL_TABLET | Freq: Once | ORAL | Status: AC
  Administered 2023-07-04: 1 via ORAL
  Filled 2023-07-04: qty 1

## 2023-07-04 MED ORDER — KETOROLAC TROMETHAMINE 30 MG/ML IJ SOLN
30.0000 mg | Freq: Once | INTRAMUSCULAR | Status: AC
  Administered 2023-07-04: 30 mg via INTRAMUSCULAR
  Filled 2023-07-04: qty 1

## 2023-07-04 NOTE — ED Provider Notes (Signed)
 [place] EMERGENCY DEPARTMENT AT [facility] Provider Note   CSN: [id number] Arrival date & time: 07/04/23  1641     Patient presents with: Knee Pain (left)   Olivia Lawson is a 38 y.o. female.   38 year old female presents with left knee pain after twisting it today.  History of patellar dislocation in the past.  States that she has sharp pain is worse with movement.  States the pain is localized to her suprapatellar region on the medial portion of her knee.  Denies any distal numbness or tingling to her foot.  Treatment use prior to arrival       Prior to Admission medications   Medication Sig Start Date End Date Taking? Authorizing Provider  ibuprofen  (ADVIL ) 200 MG tablet Take 200 mg by mouth as needed.    [provider]  ketoconazole (NIZORAL) 2 % shampoo Apply topically. 06/30/21   [provider]  Vitamin D , Ergocalciferol , (DRISDOL ) 1.25 MG (50000 UNIT) CAPS capsule Take 1 capsule (50,000 Units total) by mouth every 7 (seven) days. 04/14/23   Danford, Acie Holiday D, NP    Allergies: Influenza a (h1n1) monovalent vaccine, Tioconazole, and Monistat [miconazole]    Review of Systems  All other systems reviewed and are negative.   Updated Vital Signs BP (!) 127/115 (BP Location: Left Arm)   Pulse 72   Temp 98.6 F (37 C) (Oral)   Resp 19   Ht 1.664 m (5' 5.5)   Wt 91.2 kg   SpO2 99%   BMI 32.95 kg/m   Physical Exam Vitals and nursing note reviewed.  Constitutional:      General: She is not in acute distress.    Appearance: Normal appearance. She is well-developed. She is not toxic-appearing.  HENT:     Head: Normocephalic and atraumatic.   Eyes:     General: Lids are normal.     Conjunctiva/sclera: Conjunctivae normal.     Pupils: Pupils are equal, round, and reactive to light.   Neck:     Thyroid: No thyroid mass.     Trachea: No tracheal deviation.   Cardiovascular:     Rate and Rhythm: Normal rate and regular rhythm.      Heart sounds: Normal heart sounds. No murmur heard.    No gallop.  Pulmonary:     Effort: Pulmonary effort is normal. No respiratory distress.     Breath sounds: Normal breath sounds. No stridor. No decreased breath sounds, wheezing, rhonchi or rales.  Abdominal:     General: There is no distension.     Palpations: Abdomen is soft.     Tenderness: There is no abdominal tenderness. There is no rebound.   Musculoskeletal:     Cervical back: Normal range of motion and neck supple.     Left knee: No effusion. Decreased range of motion. Tenderness present.       Legs:   Skin:    General: Skin is warm and dry.     Findings: No abrasion or rash.   Neurological:     Mental Status: She is alert and oriented to person, place, and time. Mental status is at baseline.     GCS: GCS eye subscore is 4. GCS verbal subscore is 5. GCS motor subscore is 6.     Cranial Nerves: No cranial nerve deficit.     Sensory: No sensory deficit.     Motor: Motor function is intact.   Psychiatric:  Attention and Perception: Attention normal.        Speech: Speech normal.        Behavior: Behavior normal.     (all labs ordered are listed, but only abnormal results are displayed) Labs Reviewed - No data to display  EKG: None  Radiology: No results found.   Procedures   Medications Ordered in the ED  oxyCODONE -acetaminophen  (PERCOCET/ROXICET) 5-325 MG per tablet 1 tablet (has no administration in time range)                                    Medical Decision Making Amount and/or Complexity of Data Reviewed Radiology: ordered.  Risk Prescription drug management.   Patient given pain medication here with Percocet as well as morphine subcu.  X-ray of left knee per my interpretation shows no acute fracture.  Patient given knee immobilizer and has scheduled MRI for 1 week     Final diagnoses:  None    ED Discharge Orders     None          Lind Repine, MD 07/04/23  1839

## 2023-07-04 NOTE — ED Triage Notes (Addendum)
 Pt presents to ED with c/o possible patella dislocation on the left knee. Injury occurred on the 2nd and came to ED and stated they were trying to rule out a patella dislocation so they scheduled a MRI, which is next week. Today was getting out of shower and twisted her leg the wrong way and is now having pain and cramping. Is also unable to bear any weight.

## 2023-07-04 NOTE — ED Notes (Signed)
Ortho tech called for knee immobilizer. °

## 2023-07-04 NOTE — Progress Notes (Signed)
 Orthopedic Tech Progress Note Patient Details:  Olivia Lawson 1985-01-22 [id number]  Ortho Devices Type of Ortho Device: Knee Immobilizer Ortho Device/Splint Location: LLE Ortho Device/Splint Interventions: Ordered, Application, Adjustment   Post Interventions Patient Tolerated: Well Instructions Provided: Adjustment of device, Care of device  Herbie Loll 07/04/2023, 6:39 PM

## 2023-07-04 NOTE — ED Notes (Signed)
 X-ray at bedside

## 2023-07-05 ENCOUNTER — Ambulatory Visit (INDEPENDENT_AMBULATORY_CARE_PROVIDER_SITE_OTHER): Admitting: Adult Health

## 2023-08-17 ENCOUNTER — Ambulatory Visit (INDEPENDENT_AMBULATORY_CARE_PROVIDER_SITE_OTHER): Admitting: Adult Health

## 2023-08-17 ENCOUNTER — Encounter (INDEPENDENT_AMBULATORY_CARE_PROVIDER_SITE_OTHER): Payer: Self-pay | Admitting: Adult Health

## 2023-08-17 ENCOUNTER — Telehealth (INDEPENDENT_AMBULATORY_CARE_PROVIDER_SITE_OTHER): Payer: Self-pay | Admitting: *Deleted

## 2023-08-17 VITALS — BP 110/64 | HR 60 | Temp 97.8°F | Ht 67.0 in | Wt 200.0 lb

## 2023-08-17 DIAGNOSIS — E559 Vitamin D deficiency, unspecified: Secondary | ICD-10-CM

## 2023-08-17 DIAGNOSIS — M25562 Pain in left knee: Secondary | ICD-10-CM | POA: Diagnosis not present

## 2023-08-17 DIAGNOSIS — R948 Abnormal results of function studies of other organs and systems: Secondary | ICD-10-CM

## 2023-08-17 DIAGNOSIS — Z6831 Body mass index (BMI) 31.0-31.9, adult: Secondary | ICD-10-CM

## 2023-08-17 DIAGNOSIS — Z0000 Encounter for general adult medical examination without abnormal findings: Secondary | ICD-10-CM

## 2023-08-17 DIAGNOSIS — E669 Obesity, unspecified: Secondary | ICD-10-CM

## 2023-08-17 DIAGNOSIS — Z6833 Body mass index (BMI) 33.0-33.9, adult: Secondary | ICD-10-CM

## 2023-08-17 MED ORDER — VITAMIN D (ERGOCALCIFEROL) 1.25 MG (50000 UNIT) PO CAPS: 50000.0000 [IU] | ORAL_CAPSULE | ORAL | 0 refills | Status: DC

## 2023-08-17 NOTE — Progress Notes (Signed)
 WEIGHT SUMMARY AND BIOMETRICS  Vitals Temp: 97.8 F (36.6 C) BP: 110/64 Pulse Rate: 60 SpO2: 100 %   Anthropometric Measurements Height: 5' 7 (1.702 m) Weight: 200 lb (90.7 kg) BMI (Calculated): 31.32 Weight at Last Visit: 201 lb Weight Lost Since Last Visit: 1 lb Weight Gained Since Last Visit: 0 Starting Weight: 213 lb Total Weight Loss (lbs): 13 lb (5.897 kg)   Body Composition  Body Fat %: 41.5 % Fat Mass (lbs): 83.2 lbs Muscle Mass (lbs): 111.2 lbs Total Body Water (lbs): 81.6 lbs Visceral Fat Rating : 8   Other Clinical Data Fasting: no Labs: no Today's Visit #: 8 Starting Date: 09/30/22    Chief Complaint:   OBESITY Olivia Lawson is here to discuss her progress with her obesity treatment plan.  She is on the keeping a food journal and adhering to recommended goals of 1100-1200 calories and 85g+ protein and states she is following her eating plan approximately 0 % of the time.  She states she is exercising:None, s/p L knee   Interim History:  04/14/2023 last OV at [facility]  She started at [facility] and suffered a slip in fall in the [employer].  OV Notes from [facility] 06/21/2023 06/21/2023 Assessment: - concern for acute transient lateral patellofemoral joint dislocation of left knee   Plan: - temporary conservative management and follow-up with sports medicine team in 1 week - careful WB of LLE in KI; crutches to aid with ambulation as needed - meloxicam for pain/ inflammation; tylenol  for pain - Norco for severe pain; advised to take with extreme caution; cancelled original order to [facility] and sent new prescription to [facility] per patient request - ice - elevation - topicals - rest/ activity modification; SLR and isometric quad exercises as able - observation - advised of warning signs/ symptoms that would necessitate return to urgent care/ ED - patient voiced understanding and agreement with plan - all questions  answered and concerns addressed    Reviewed several other encounters for L knee pain and subsequent surgical repair  She has been consuming higher protein foods and remaining well hydrated, she estimates to drinks 60-80 oz water/day  She has been trying to use OTC analgesics during day and reserve Narcotics for evening pain control  Subjective:   1. Healthcare maintenance She is home on Short Term Disability and her medical care is currently covered by [employer]. She is unsure if she will continue at [facility] and what her future commercial health insurance will cover in terms of chronic medical care. Recommend Virtual OVs if able- to reduce discomfort with driving/walking.  2. Vitamin D  deficiency  Latest Reference Range & Units 04/07/23 16:47  VITD 30.00 - 100.00 ng/mL 34.94   She has been off weekly Ergocalciferol  for several months.  3. Abnormal metabolism Anticipated RMR 1638   09/30/22 07:00  RMR 1339   Her last RMR is slower than expected.  4. Left knee pain, unspecified chronicity [facility] OV NOTES 07/09/202507/09/2025Today we discussed that she has a couple of issues with the left knee. She has a bucket-handle tear of the medial meniscus that does appear to be a red-white zone tear with incarcerated fragment in the notch. We also discussed complete ACL disruption. Given her young age and activity level would recommend ACL reconstruction with hamstring autograft. Furthermore, recommend somewhat urgent management given the bucket-handle tear. Hopefully this is repairable but we discussed repair versus meniscectomy. We discussed my recommendation for  knee arthroscopy and the associated risk and benefits including but not limited to, the risk of bleeding, infection, damage to surrounding nerves and vessels, stiffness, failure of pain relief, risk of progression of disease, need for further surgery as well as the risk of DVT and the risk of anesthesia. They have  provided informed consent.[email or web address] [email or web address]/10/2023 00:30:58  She underwent surgical intervention 08/04/2023  Pt presents with ace warp, hinge brace, and crutches She is ambulating very slowly with difficulty   Assessment/Plan:   1. Healthcare maintenance (Primary) Increase protein, remain well hydrated, continue recovery per Orthopedic Care Team  2. Vitamin D  deficiency Restart - Vitamin D , Ergocalciferol , (DRISDOL ) 1.25 MG (50000 UNIT) CAPS capsule; Take 1 capsule (50,000 Units total) by mouth every 7 (seven) days.  Dispense: 12 capsule; Refill: 0  3. Abnormal metabolism Continue to increase protein at meals and snacks Strive for at least 30g at each meal High protein snack list provided   4. Left knee pain, unspecified chronicity Continue PT (home and out patient) Continue recovery plan per Orthopedic Care Team  5. BMI 33.0-33.9,adult, Current BMI 31.5  Olivia Lawson is not currently in the action stage of change. As such, her goal is to maintain weight for now. She has agreed to keeping a food journal and adhering to recommended goals of 1100-1200 calories and 85g+ protein.   Exercise goals: No exercise has been prescribed at this time.  Behavioral modification strategies: increasing lean protein intake, decreasing simple carbohydrates, increasing vegetables, increasing water intake, keeping healthy foods in the home, and planning for success.  Olivia Lawson has agreed to follow-up with our clinic in 4 weeks. She was informed of the importance of frequent follow-up visits to maximize her success with intensive lifestyle modifications for her multiple health conditions.   Check Fasting Labs tomorrow- pt aware to arrive fasting and anytome after 0730.  Objective:   Blood pressure 110/64, pulse 60, temperature 97.8 F (36.6 C), height 5' 7 (1.702 m), weight 200 lb (90.7 kg), last menstrual period 08/01/2023, SpO2 100%. Body mass index is 31.32 kg/m.  General: Cooperative, alert,  well developed, in no acute distress. HEENT: Conjunctivae and lids unremarkable. Cardiovascular: Regular rhythm.  Lungs: Normal work of breathing. Neurologic: No focal deficits.   Lab Results  Component Value Date   CREATININE 0.91 04/07/2023   BUN 15 04/07/2023   NA 136 04/07/2023   K 4.1 04/07/2023   CL 104 04/07/2023   CO2 26 04/07/2023   Lab Results  Component Value Date   ALT 10 04/07/2023   AST 17 04/07/2023   GGT 16 08/17/2019   ALKPHOS 44 04/07/2023   BILITOT 0.5 04/07/2023   Lab Results  Component Value Date   HGBA1C 5.0 04/07/2023   HGBA1C 5.2 09/30/2022   HGBA1C 5.0 03/30/2022   HGBA1C 5.1 08/03/2019   Lab Results  Component Value Date   INSULIN  7.3 09/30/2022   Lab Results  Component Value Date   TSH 1.18 04/07/2023   Lab Results  Component Value Date   CHOL 248 (H) 04/07/2023   HDL 63.90 04/07/2023   LDLCALC 171 (H) 04/07/2023   TRIG 67.0 04/07/2023   CHOLHDL 4 04/07/2023   Lab Results  Component Value Date   VD25OH 34.94 04/07/2023   VD25OH 31.0 09/30/2022   VD25OH 24.03 (L) 03/30/2022   Lab Results  Component Value Date   WBC 4.8 04/07/2023   HGB 13.5 04/07/2023   HCT 40.6 04/07/2023   MCV 91.2 04/07/2023   PLT 285.0 04/07/2023  No results found for: IRON, TIBC, FERRITIN  Attestation Statements:   Reviewed by clinician on day of visit: allergies, medications, problem list, medical history, surgical history, family history, social history, and previous encounter notes.  Extra time charting after OV as multiple messages sent to pt about treatment information, etc  I have reviewed the above documentation for accuracy and completeness, and I agree with the above. -  Rishan Oyama d. Symir Mah, NP-C

## 2023-08-18 ENCOUNTER — Other Ambulatory Visit (INDEPENDENT_AMBULATORY_CARE_PROVIDER_SITE_OTHER): Payer: Self-pay | Admitting: Adult Health

## 2023-08-18 DIAGNOSIS — Z0000 Encounter for general adult medical examination without abnormal findings: Secondary | ICD-10-CM

## 2023-08-18 DIAGNOSIS — Z6833 Body mass index (BMI) 33.0-33.9, adult: Secondary | ICD-10-CM

## 2023-08-18 DIAGNOSIS — R948 Abnormal results of function studies of other organs and systems: Secondary | ICD-10-CM

## 2023-08-18 DIAGNOSIS — E559 Vitamin D deficiency, unspecified: Secondary | ICD-10-CM

## 2023-08-19 LAB — LIPID PANEL
Chol/HDL Ratio: 2.8 ratio (ref 0.0–4.4)
Cholesterol, Total: 174 mg/dL (ref 100–199)
HDL: 63 mg/dL (ref >39)
LDL Chol Calc (NIH): 101 mg/dL — ABNORMAL HIGH (ref 0–99)
Triglycerides: 50 mg/dL (ref 0–149)
VLDL Cholesterol Cal: 10 mg/dL (ref 5–40)

## 2023-08-19 LAB — COMPREHENSIVE METABOLIC PANEL WITH GFR
ALT: 11 IU/L (ref 0–32)
AST: 23 IU/L (ref 0–40)
Albumin: 3.8 g/dL — ABNORMAL LOW (ref 3.9–4.9)
Alkaline Phosphatase: 71 IU/L (ref 44–121)
BUN/Creatinine Ratio: 21 (ref 9–23)
BUN: 16 mg/dL (ref 6–20)
Bilirubin Total: 0.5 mg/dL (ref 0.0–1.2)
CO2: 22 mmol/L (ref 20–29)
Calcium: 9.3 mg/dL (ref 8.7–10.2)
Chloride: 104 mmol/L (ref 96–106)
Creatinine, Ser: 0.75 mg/dL (ref 0.57–1.00)
Globulin, Total: 2.8 g/dL (ref 1.5–4.5)
Glucose: 79 mg/dL (ref 70–99)
Potassium: 4.2 mmol/L (ref 3.5–5.2)
Sodium: 139 mmol/L (ref 134–144)
Total Protein: 6.6 g/dL (ref 6.0–8.5)
eGFR: 104 mL/min/1.73 (ref >59)

## 2023-08-19 LAB — VITAMIN B12: Vitamin B-12: 1244 pg/mL (ref 232–1245)

## 2023-08-19 LAB — INSULIN, RANDOM: INSULIN: 7.5 u[IU]/mL (ref 2.6–24.9)

## 2023-08-19 LAB — VITAMIN D 25 HYDROXY (VIT D DEFICIENCY, FRACTURES): Vit D, 25-Hydroxy: 34.7 ng/mL (ref 30.0–100.0)

## 2023-08-19 LAB — HEMOGLOBIN A1C
Est. average glucose Bld gHb Est-mCnc: 103 mg/dL
Hgb A1c MFr Bld: 5.2 % (ref 4.8–5.6)

## 2023-08-23 NOTE — Telephone Encounter (Signed)
 error

## 2023-09-23 ENCOUNTER — Telehealth (INDEPENDENT_AMBULATORY_CARE_PROVIDER_SITE_OTHER): Admitting: Adult Health

## 2023-09-27 ENCOUNTER — Telehealth (INDEPENDENT_AMBULATORY_CARE_PROVIDER_SITE_OTHER): Payer: Self-pay | Admitting: Adult Health

## 2023-09-27 ENCOUNTER — Encounter (INDEPENDENT_AMBULATORY_CARE_PROVIDER_SITE_OTHER): Payer: Self-pay | Admitting: Adult Health

## 2023-09-27 VITALS — Ht 67.0 in

## 2023-09-27 DIAGNOSIS — E88819 Insulin resistance, unspecified: Secondary | ICD-10-CM

## 2023-09-27 DIAGNOSIS — M25562 Pain in left knee: Secondary | ICD-10-CM

## 2023-09-27 DIAGNOSIS — Z6832 Body mass index (BMI) 32.0-32.9, adult: Secondary | ICD-10-CM

## 2023-09-27 DIAGNOSIS — E782 Mixed hyperlipidemia: Secondary | ICD-10-CM

## 2023-09-27 DIAGNOSIS — E559 Vitamin D deficiency, unspecified: Secondary | ICD-10-CM

## 2023-09-27 DIAGNOSIS — Z6833 Body mass index (BMI) 33.0-33.9, adult: Secondary | ICD-10-CM

## 2023-09-27 MED ORDER — VITAMIN D (ERGOCALCIFEROL) 1.25 MG (50000 UNIT) PO CAPS: 50000.0000 [IU] | ORAL_CAPSULE | ORAL | 0 refills | Status: AC

## 2023-09-27 NOTE — Progress Notes (Unsigned)
 WEIGHT SUMMARY AND BIOMETRICS  No data recorded Anthropometric Measurements Height: 5' 7 (1.702 m) Weight at Last Visit: 200 lb Starting Weight: 213 lb   No data recorded Other Clinical Data Today's Visit #: 9 Comments: virtual    Chief Complaint:  I connected with  Olivia Lawson on 09/27/23 by a video and audio enabled telemedicine application and verified that I am speaking with the correct person using two identifiers.  Patient Location: Home  Provider Location: Office/Clinic  I discussed the limitations of evaluation and management by telemedicine. The patient expressed understanding and agreed to proceed.  OBESITY Olivia Lawson is here to discuss her progress with her obesity treatment plan.  She is on the keeping a food journal and adhering to recommended goals of 1100-1200 calories and 85g+ protein and states she is following her eating plan approximately 0 % of the time.  She states she is exercising: Out Patient Physical Therapy (PT) 2 x weekly  Interim History:  08/04/2023  left knee scope with medial menisectomy, acl reconstruction with [facility]  She resumed care with [facility] on 08/17/2023 Fasting Labs completed 08/18/2023- reviewed results today  MyChart video visit to aid in pt's comfort, as she continues to recover from L knee surgery.  She has been officially released from [employer]. She hopes to re apply once fully recovered. 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 Left knee pain, unspecified chronicity [facility] OV NOTES 07/09/202507/09/2025Today we discussed that she has a couple of issues with the left knee. She has a bucket-handle tear of the medial meniscus that does appear to be a red-white zone tear with incarcerated fragment in the notch. We also discussed complete ACL disruption. Given her young age and activity level would recommend ACL reconstruction with hamstring autograft. Furthermore,  recommend somewhat urgent management given the bucket-handle tear. Hopefully this is repairable but we discussed repair versus meniscectomy. We discussed my recommendation for knee arthroscopy and the associated risk and benefits including but not limited to, the risk of bleeding, infection, damage to surrounding nerves and vessels, stiffness, failure of pain relief, risk of progression of disease, need for further surgery as well as the risk of DVT and the risk of anesthesia. They have provided informed consent.[email or web address] [email or web address]/10/2023 00:30:58   She underwent surgical intervention 08/04/2023   She is currently participating in out pt Physical Therapy (PT) 2 x weekly She has decreased to using only one cane for assistance with ambulation.  She continue to experience swelling and edema of L knee She will narcotic pain medication several times per week  2. Mixed hyperlipidemia Discussed Labs Lipid Panel     Component Value Date/Time   CHOL 174 08/18/2023 1300   TRIG 50 08/18/2023 1300   HDL 63 08/18/2023 1300   CHOLHDL 2.8 08/18/2023 1300   CHOLHDL 4 04/07/2023 1647   VLDL 13.4 04/07/2023 1647   LDLCALC 101 (H) 08/18/2023 1300   LABVLDL 10 08/18/2023 1300    Values stable with one elevation, LDL just slightly above goal  3. Insulin  resistance Discussed Labs  Latest Reference Range & Units 08/18/23 13:00  Glucose 70 - 99 mg/dL 79  Hemoglobin J8R 4.8 - 5.6 % 5.2  Est. average glucose Bld gHb Est-mCnc mg/dL 896  INSULIN  2.6 - 24.9 uIU/mL 7.5   A1c and CBG at goal Insulin  level just slightly above goal of 5  4. Vitamin D  deficiency Discussed Labs  Latest Reference Range & Units 08/18/23 13:00  Vitamin D , 25-Hydroxy 30.0 - 100.0 ng/mL 34.7   Vit D level well below goal of 50-70 She is on weekly Ergocalciferol -denies N/V/Muscle Weakness  Assessment/Plan:   1. Left knee pain, unspecified chronicity Continue home and out pt PT  2. Mixed hyperlipidemia Reduce  saturated fat and continue daily activity per Orthopedic Care Team  3. Insulin  resistance Increase lean protein and limit sugar/simple CHO Remain active per Orthopedic Care Team  4. Vitamin D  deficiency (Primary) Refill - Vitamin D , Ergocalciferol , (DRISDOL ) 1.25 MG (50000 UNIT) CAPS capsule; Take 1 capsule (50,000 Units total) by mouth every 7 (seven) days.  Dispense: 12 capsule; Refill: 0  5. BMI 33.0-33.9,adult, CURRENT BMI 32.6  Olivia Lawson is currently in the action stage of change. As such, her goal is to continue with weight loss efforts. She has agreed to keeping a food journal and adhering to recommended goals of 1100-1200 calories and 85g+ protein.   Exercise goals: Activity per PT and Orthopedic Surgeon  Behavioral modification strategies: increasing lean protein intake, decreasing simple carbohydrates, increasing vegetables, increasing water intake, no skipping meals, meal planning and cooking strategies, keeping healthy foods in the home, ways to avoid boredom eating, better snacking choices, emotional eating strategies, and planning for success.  Olivia Lawson has agreed to follow-up with our clinic in 4 weeks. She was informed of the importance of frequent follow-up visits to maximize her success with intensive lifestyle modifications for her multiple health conditions.   Objective:   Height 5' 7 (1.702 m). Body mass index is 31.32 kg/m.  General: Cooperative, alert, well developed, in no acute distress. HEENT: Conjunctivae and lids unremarkable. Cardiovascular: Regular rhythm.  Lungs: Normal work of breathing. Neurologic: No focal deficits.   Lab Results  Component Value Date   CREATININE 0.75 08/18/2023   BUN 16 08/18/2023   NA 139 08/18/2023   K 4.2 08/18/2023   CL 104 08/18/2023   CO2 22 08/18/2023   Lab Results  Component Value Date   ALT 11 08/18/2023   AST 23 08/18/2023   GGT 16 08/17/2019   ALKPHOS 71 08/18/2023   BILITOT 0.5 08/18/2023   Lab Results   Component Value Date   HGBA1C 5.2 08/18/2023   HGBA1C 5.0 04/07/2023   HGBA1C 5.2 09/30/2022   HGBA1C 5.0 03/30/2022   HGBA1C 5.1 08/03/2019   Lab Results  Component Value Date   INSULIN  7.5 08/18/2023   INSULIN  7.3 09/30/2022   Lab Results  Component Value Date   TSH 1.18 04/07/2023   Lab Results  Component Value Date   CHOL 174 08/18/2023   HDL 63 08/18/2023   LDLCALC 101 (H) 08/18/2023   TRIG 50 08/18/2023   CHOLHDL 2.8 08/18/2023   Lab Results  Component Value Date   VD25OH 34.7 08/18/2023   VD25OH 34.94 04/07/2023   VD25OH 31.0 09/30/2022   Lab Results  Component Value Date   WBC 4.8 04/07/2023   HGB 13.5 04/07/2023   HCT 40.6 04/07/2023   MCV 91.2 04/07/2023   PLT 285.0 04/07/2023   No results found for: IRON, TIBC, FERRITIN  Attestation Statements:   Reviewed by clinician on day of visit: allergies, medications, problem list, medical history, surgical history, family history, social history, and previous encounter notes.  I have reviewed the above documentation for accuracy and completeness, and I agree with the above. -  Artist Bloom d. Glynn Yepes, NP-C

## 2023-10-06 ENCOUNTER — Encounter: Payer: Self-pay | Admitting: Emergency Medicine

## 2023-10-07 ENCOUNTER — Other Ambulatory Visit: Payer: Self-pay | Admitting: Radiology

## 2023-10-07 MED ORDER — KETOCONAZOLE 2 % EX SHAM: MEDICATED_SHAMPOO | CUTANEOUS | 1 refills | Status: DC

## 2023-11-26 ENCOUNTER — Other Ambulatory Visit: Payer: Self-pay | Admitting: Emergency Medicine

## 2023-12-29 ENCOUNTER — Other Ambulatory Visit (INDEPENDENT_AMBULATORY_CARE_PROVIDER_SITE_OTHER): Payer: Self-pay | Admitting: Adult Health

## 2023-12-29 DIAGNOSIS — E559 Vitamin D deficiency, unspecified: Secondary | ICD-10-CM

## 2024-01-26 ENCOUNTER — Other Ambulatory Visit: Payer: Self-pay | Admitting: Emergency Medicine
